# Patient Record
Sex: Female | Born: 2000 | Race: Black or African American | Hispanic: No | Marital: Single | Smoking: Never smoker
Health system: Southern US, Community
[De-identification: ages and names within clinical notes are randomized; demographics above are authoritative.]

## PROBLEM LIST (undated history)

## (undated) ENCOUNTER — Inpatient Hospital Stay (HOSPITAL_COMMUNITY): Payer: Self-pay

## (undated) DIAGNOSIS — I456 Pre-excitation syndrome: Secondary | ICD-10-CM

## (undated) HISTORY — DX: Pre-excitation syndrome: I45.6

## (undated) HISTORY — PX: NO PAST SURGERIES: SHX2092

---
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
## 2020-01-04 DIAGNOSIS — Z975 Presence of (intrauterine) contraceptive device: Secondary | ICD-10-CM | POA: Diagnosis not present

## 2020-01-04 DIAGNOSIS — N921 Excessive and frequent menstruation with irregular cycle: Secondary | ICD-10-CM | POA: Diagnosis not present

## 2020-01-04 DIAGNOSIS — N898 Other specified noninflammatory disorders of vagina: Secondary | ICD-10-CM | POA: Diagnosis not present

## 2020-01-04 DIAGNOSIS — T192XXA Foreign body in vulva and vagina, initial encounter: Secondary | ICD-10-CM | POA: Diagnosis not present

## 2020-01-21 DIAGNOSIS — Z309 Encounter for contraceptive management, unspecified: Secondary | ICD-10-CM | POA: Diagnosis not present

## 2020-01-21 DIAGNOSIS — Z3042 Encounter for surveillance of injectable contraceptive: Secondary | ICD-10-CM | POA: Diagnosis not present

## 2020-01-21 DIAGNOSIS — Z3046 Encounter for surveillance of implantable subdermal contraceptive: Secondary | ICD-10-CM | POA: Diagnosis not present

## 2020-07-26 DIAGNOSIS — R350 Frequency of micturition: Secondary | ICD-10-CM | POA: Diagnosis not present

## 2020-07-26 DIAGNOSIS — N898 Other specified noninflammatory disorders of vagina: Secondary | ICD-10-CM | POA: Diagnosis not present

## 2021-03-06 DIAGNOSIS — I456 Pre-excitation syndrome: Secondary | ICD-10-CM | POA: Insufficient documentation

## 2021-03-07 DIAGNOSIS — R002 Palpitations: Secondary | ICD-10-CM | POA: Diagnosis not present

## 2021-03-07 DIAGNOSIS — R079 Chest pain, unspecified: Secondary | ICD-10-CM | POA: Diagnosis not present

## 2021-03-08 DIAGNOSIS — R000 Tachycardia, unspecified: Secondary | ICD-10-CM | POA: Diagnosis not present

## 2021-03-08 DIAGNOSIS — I456 Pre-excitation syndrome: Secondary | ICD-10-CM | POA: Diagnosis not present

## 2021-03-08 DIAGNOSIS — R002 Palpitations: Secondary | ICD-10-CM | POA: Diagnosis not present

## 2021-03-08 DIAGNOSIS — I209 Angina pectoris, unspecified: Secondary | ICD-10-CM | POA: Diagnosis not present

## 2021-07-19 ENCOUNTER — Emergency Department (HOSPITAL_COMMUNITY)
Admission: EM | Admit: 2021-07-19 | Discharge: 2021-07-19 | Discharge status: Against Medical Advice | Payer: Medicaid Other | Source: Home / Self Care

## 2021-07-20 ENCOUNTER — Other Ambulatory Visit: Payer: Self-pay

## 2021-07-20 ENCOUNTER — Encounter (HOSPITAL_BASED_OUTPATIENT_CLINIC_OR_DEPARTMENT_OTHER): Payer: Self-pay

## 2021-07-20 DIAGNOSIS — R9431 Abnormal electrocardiogram [ECG] [EKG]: Secondary | ICD-10-CM | POA: Diagnosis not present

## 2021-07-20 DIAGNOSIS — I456 Pre-excitation syndrome: Secondary | ICD-10-CM | POA: Insufficient documentation

## 2021-07-20 DIAGNOSIS — R002 Palpitations: Secondary | ICD-10-CM | POA: Diagnosis not present

## 2021-07-20 LAB — CBC
HCT: 42.6 % (ref 36.0–46.0)
Hemoglobin: 14.3 g/dL (ref 12.0–15.0)
MCH: 30.7 pg (ref 26.0–34.0)
MCHC: 33.6 g/dL (ref 30.0–36.0)
MCV: 91.4 fL (ref 80.0–100.0)
Platelets: 303 10*3/uL (ref 150–400)
RBC: 4.66 MIL/uL (ref 3.87–5.11)
RDW: 12.2 % (ref 11.5–15.5)
WBC: 8.8 10*3/uL (ref 4.0–10.5)
nRBC: 0 % (ref 0.0–0.2)

## 2021-07-20 NOTE — ED Triage Notes (Signed)
Pt. States she has run out of her medication; taking metoprolol and ran out of it;taking 25 mg and started having palpitations. Pt. States she is having chest pains 3/10. Pt states pain center of chest. 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 Pt. States she is having minor dizziness, denies nausea, vomiting.

## 2021-07-21 ENCOUNTER — Emergency Department (HOSPITAL_BASED_OUTPATIENT_CLINIC_OR_DEPARTMENT_OTHER)
Admission: EM | Admit: 2021-07-21 | Discharge: 2021-07-21 | Disposition: A | Discharge status: Home / Self Care | Payer: Medicaid Other | Attending: Emergency Medicine | Admitting: Emergency Medicine

## 2021-07-21 ENCOUNTER — Encounter (HOSPITAL_BASED_OUTPATIENT_CLINIC_OR_DEPARTMENT_OTHER): Payer: Self-pay | Admitting: Emergency Medicine

## 2021-07-21 ENCOUNTER — Emergency Department (HOSPITAL_BASED_OUTPATIENT_CLINIC_OR_DEPARTMENT_OTHER): Payer: Medicaid Other | Admitting: Radiology

## 2021-07-21 DIAGNOSIS — R002 Palpitations: Secondary | ICD-10-CM

## 2021-07-21 DIAGNOSIS — I456 Pre-excitation syndrome: Secondary | ICD-10-CM

## 2021-07-21 DIAGNOSIS — R9431 Abnormal electrocardiogram [ECG] [EKG]: Secondary | ICD-10-CM

## 2021-07-21 LAB — BASIC METABOLIC PANEL
Anion gap: 9 (ref 5–15)
BUN: 8 mg/dL (ref 6–20)
CO2: 24 mmol/L (ref 22–32)
Calcium: 9.6 mg/dL (ref 8.9–10.3)
Chloride: 107 mmol/L (ref 98–111)
Creatinine, Ser: 0.84 mg/dL (ref 0.44–1.00)
GFR, Estimated: >60 mL/min (ref >60)
Glucose, Bld: 81 mg/dL (ref 70–99)
Potassium: 3.7 mmol/L (ref 3.5–5.1)
Sodium: 140 mmol/L (ref 135–145)

## 2021-07-21 LAB — PREGNANCY, URINE: Preg Test, Ur: NEGATIVE

## 2021-07-21 LAB — TROPONIN I (HIGH SENSITIVITY): Troponin I (High Sensitivity): 2 ng/L (ref <18)

## 2021-07-21 LAB — D-DIMER, QUANTITATIVE: D-Dimer, Quant: <0.27 ug/mL-FEU (ref 0.00–0.50)

## 2021-07-21 MED ORDER — METOPROLOL SUCCINATE ER 25 MG PO TB24: 25.0000 mg | ORAL_TABLET | Freq: Every day | ORAL | 0 refills | Status: DC

## 2021-07-21 NOTE — ED Provider Notes (Signed)
[facility] EMERGENCY DEPT Provider Note   CSN: [id number] Arrival date & time: 07/20/21  2304     History  Chief Complaint  Patient presents with   Palpitations    Hannah Esparza is a 21 y.o. female.   Palpitations Palpitations quality:  Fast Onset quality:  Sudden Timing:  Constant Progression:  Unchanged Chronicity:  New Context: not anxiety, not appetite suppressants, not blood loss, not bronchodilators, not dehydration, not exercise, not hyperventilation, not nicotine and not stimulant use   Relieved by:  Nothing Worsened by:  Nothing Ineffective treatments:  None tried Associated symptoms: no back pain, no chest pain, no chest pressure, no lower extremity edema, no PND and no shortness of breath   Risk factors: no diabetes mellitus, no hx of atrial fibrillation, no hx of PE and no hyperthyroidism   Risk factors comment:  WPW Patient who reports WPW diagnosed in [place] presents with palpitations and is out of her metoprolol.  No SOB.  No DOE.      Home Medications Prior to Admission medications   Medication Sig Start Date End Date Taking? Authorizing Provider  metoprolol succinate (TOPROL-XL) 25 MG 24 hr tablet Take 1 tablet (25 mg total) by mouth daily. 07/21/21  Yes Chayden Garrelts, MD      Allergies    Patient has no known allergies.    Review of Systems   Review of Systems  Constitutional:  Negative for fever.  HENT:  Negative for congestion and facial swelling.   Eyes:  Negative for redness.  Respiratory:  Negative for shortness of breath.   Cardiovascular:  Positive for palpitations. Negative for chest pain and PND.  Genitourinary:  Negative for difficulty urinating.  Musculoskeletal:  Negative for back pain.  Skin:  Negative for rash.  Neurological:  Negative for facial asymmetry.  Psychiatric/Behavioral:  Negative for agitation.   All other systems reviewed and are negative.  Physical Exam Updated Vital Signs BP 109/73    Pulse 81     Temp 98.3 F (36.8 C)    Resp 18    Ht 5' 5.25" (1.657 m)    Wt 56.7 kg    LMP 07/12/2021 (Exact Date)    SpO2 100%    BMI 20.64 kg/m  Physical Exam Vitals and nursing note reviewed.  Constitutional:      General: She is not in acute distress.    Appearance: Normal appearance.     Comments: Smells of marijuana   HENT:     Head: Normocephalic and atraumatic.     Nose: Nose normal.  Eyes:     Extraocular Movements: Extraocular movements intact.     Conjunctiva/sclera: Conjunctivae normal.     Pupils: Pupils are equal, round, and reactive to light.  Cardiovascular:     Rate and Rhythm: Normal rate and regular rhythm.     Pulses: Normal pulses.     Heart sounds: Normal heart sounds.  Pulmonary:     Effort: Pulmonary effort is normal.     Breath sounds: Normal breath sounds.  Abdominal:     General: Abdomen is flat. Bowel sounds are normal.     Palpations: Abdomen is soft.     Tenderness: There is no abdominal tenderness. There is no guarding.  Musculoskeletal:        General: No tenderness. Normal range of motion.     Right lower leg: No edema.     Left lower leg: No edema.  Skin:    General: Skin is warm and  dry.     Capillary Refill: Capillary refill takes less than 2 seconds.  Neurological:     General: No focal deficit present.     Mental Status: She is alert and oriented to person, place, and time.     Deep Tendon Reflexes: Reflexes normal.  Psychiatric:        Behavior: Behavior normal.    ED Results / Procedures / Treatments   Labs (all labs ordered are listed, but only abnormal results are displayed) Labs Reviewed  BASIC METABOLIC PANEL  CBC  PREGNANCY, URINE  D-DIMER, QUANTITATIVE  TROPONIN I (HIGH SENSITIVITY)  TROPONIN I (HIGH SENSITIVITY)    EKG None  Radiology DG Chest 2 View  Result Date: 07/21/2021 CLINICAL DATA:  Palpitations. EXAM: CHEST - 2 VIEW COMPARISON:  May 10, 2013 FINDINGS: The heart size and mediastinal contours are within normal  limits. Both lungs are clear. The visualized skeletal structures are unremarkable. IMPRESSION: No active cardiopulmonary disease. Electronically Signed   By: Aram Candela M.D.   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    Details: notes from OSH in care everywhere Labs: ordered.    Details: personally reviewed: normal CBC, normal creatinine and electrolytes, normal troponin and negative ddimer < .27 Radiology: ordered.    Details: normal chest xray as reviewed by me, concur with radiology Discussion of management or test interpretation with external provider(s): 219 case d/w Dr. Patsi Sears of cardiology.  Who has reviewed the EKG and changes are secondary to WPW, he states that toprol XL 25 mg should be started.  EDP asked is this was recommended given WPW and Dr. Patsi Sears states even with WPW this is safe at this time.  Also states that patient should follow up with cardiology in the office.    Risk Prescription drug management. Risk Details: Patient with known WPW feeling palpitations but HR is normal in the ED.  Ruled out for MI in the ED as well as PE with negative Ddimer in a low risk patient.  Patient restarted on metoprolol and referred to cardiology as outpatient.  Advised to avoid, caffeine, nicotine, vaping, energy drinks, stimulants and marijuana.  Patient agrees to follow up.      Final Clinical Impression(s) / ED Diagnoses Final diagnoses:  Palpitations  Abnormal EKG  WPW syndrome   Return for intractable cough, coughing up blood, fevers > 100.4 unrelieved by  medication, shortness of breath, intractable vomiting, chest pain, shortness of breath, weakness, numbness, changes in speech, facial asymmetry, abdominal pain, passing out, Inability to tolerate liquids or food, cough, altered mental status or any concerns. No signs of systemic illness or infection. The patient is nontoxic-appearing on exam and vital signs are within normal limits.  I have reviewed the triage vital signs and the nursing notes. Pertinent labs & imaging results that were available during my care of the patient were reviewed by me and considered in my medical decision making (see chart for details). After history, exam, and medical  workup I feel the patient has been appropriately medically screened and is safe for discharge home. Pertinent diagnoses were discussed with the patient. Patient was given return precautions.   Rx / DC Orders ED Discharge Orders          Ordered    metoprolol succinate (TOPROL-XL) 25 MG 24 hr tablet  Daily        07/21/21 0240              Bianney Rockwood, MD 07/21/21 6222

## 2021-07-21 NOTE — ED Notes (Signed)
Pt verbalizes understanding of discharge instructions. Opportunity for questioning and answers were provided. Pt discharged from ED to home.   ? ?

## 2021-08-13 ENCOUNTER — Emergency Department (HOSPITAL_BASED_OUTPATIENT_CLINIC_OR_DEPARTMENT_OTHER)
Admission: EM | Admit: 2021-08-13 | Discharge: 2021-08-13 | Disposition: A | Discharge status: Home / Self Care | Payer: Medicaid Other | Attending: Emergency Medicine | Admitting: Emergency Medicine

## 2021-08-13 ENCOUNTER — Encounter (HOSPITAL_BASED_OUTPATIENT_CLINIC_OR_DEPARTMENT_OTHER): Payer: Self-pay | Admitting: Emergency Medicine

## 2021-08-13 ENCOUNTER — Other Ambulatory Visit: Payer: Self-pay

## 2021-08-13 DIAGNOSIS — Z711 Person with feared health complaint in whom no diagnosis is made: Secondary | ICD-10-CM

## 2021-08-13 DIAGNOSIS — A64 Unspecified sexually transmitted disease: Secondary | ICD-10-CM | POA: Insufficient documentation

## 2021-08-13 DIAGNOSIS — Z202 Contact with and (suspected) exposure to infections with a predominantly sexual mode of transmission: Secondary | ICD-10-CM | POA: Diagnosis not present

## 2021-08-13 LAB — WET PREP, GENITAL
Clue Cells Wet Prep HPF POC: NONE SEEN
Trich, Wet Prep: NONE SEEN
WBC, Wet Prep HPF POC: <10 (ref <10)
Yeast Wet Prep HPF POC: NONE SEEN

## 2021-08-13 NOTE — ED Triage Notes (Signed)
Pt is requesting STD testing because she ahs a new boyfriend. Denies any known exposure and denies any pain. Pt states discharge has changed with a different smell so she wants to be sure is isn't an STD.  ?

## 2021-08-13 NOTE — ED Notes (Signed)
Patient verbalizes understanding of discharge instructions. Opportunity for questioning and answers were provided. Patient discharged from ED.  °

## 2021-08-13 NOTE — ED Provider Notes (Signed)
?[facility] EMERGENCY DEPT ?Provider Note ? ? ?CSN: [id number] ?Arrival date & time: 08/13/21  1249 ? ?  ? ?History ? ?Chief Complaint  ?Patient presents with  ? SEXUALLY TRANSMITTED DISEASE  ? ? ?Hannah Esparza is a 21 y.o. female. ? ?Patient presents to ER chief complaint of concern for STD.  She states she has a new boyfriend and she is sexually active, has noticed a change in her vaginal discharge and a different smell per patient.  Otherwise denies any pain denies any fevers or cough or vomiting or diarrhea.  No vaginal bleeding reported. ? ? ?  ? ?Home Medications ?Prior to Admission medications   ?Medication Sig Start Date End Date Taking? Authorizing Provider  ?metoprolol succinate (TOPROL-XL) 25 MG 24 hr tablet Take 1 tablet (25 mg total) by mouth daily. 07/21/21   Palumbo, April, MD  ?   ? ?Allergies    ?Patient has no known allergies.   ? ?Review of Systems   ?Review of Systems  ?Constitutional:  Negative for fever.  ?HENT:  Negative for ear pain.   ?Eyes:  Negative for pain.  ?Respiratory:  Negative for cough.   ?Cardiovascular:  Negative for chest pain.  ?Gastrointestinal:  Negative for abdominal pain.  ?Genitourinary:  Negative for flank pain.  ?Musculoskeletal:  Negative for back pain.  ?Skin:  Negative for rash.  ?Neurological:  Negative for headaches.  ? ?Physical Exam ?Updated Vital Signs ?BP 120/77   Pulse 85   Temp 97.7 ?F (36.5 ?C) (Temporal)   Resp 18   Ht 5\' 5"  (1.651 m)   Wt 56.7 kg   LMP 07/27/2021 (Exact Date)   SpO2 100%   BMI 20.80 kg/m?  ?Physical Exam ?Constitutional:   ?   General: She is not in acute distress. ?   Appearance: Normal appearance.  ?HENT:  ?   Head: Normocephalic.  ?   Nose: Nose normal.  ?Eyes:  ?   Extraocular Movements: Extraocular movements intact.  ?Cardiovascular:  ?   Rate and Rhythm: Normal rate.  ?Pulmonary:  ?   Effort: Pulmonary effort is normal.  ?Genitourinary: ?   Comments: Pelvic exam performed with nursing chaperone present.  Whitish  clear discharge noted.  No cervical motion tenderness on exam. ?Musculoskeletal:     ?   General: Normal range of motion.  ?   Cervical back: Normal range of motion.  ?Neurological:  ?   General: No focal deficit present.  ?   Mental Status: She is alert. Mental status is at baseline.  ? ? ?ED Results / Procedures / Treatments   ?Labs ?(all labs ordered are listed, but only abnormal results are displayed) ?Labs Reviewed  ?WET PREP, GENITAL  ?GC/CHLAMYDIA PROBE AMP ([place]) NOT AT [facility]  ? ? ?EKG ?None ? ?Radiology ?No results found. ? ?Procedures ?Procedures  ? ? ?Medications Ordered in ED ?Medications - No data to display ? ?ED Course/ Medical Decision Making/ A&P ?  ?                        ?Medical Decision Making ?Amount and/or Complexity of Data Reviewed ?Labs: ordered. ? ? ?Chart review shows prior visit February 2023 for palpitations. ? ?Work-up includes a wet mount which is unremarkable.  Sample sent for gonorrhea chlamydia evaluation.  I advised the patient to follow-up with a primary care doctor regarding any additional STD testing needs.  Patient expressed understanding.  Discharged home in stable condition. ? ?Advised  her not to involved in sexual intercourse until her results have returned negative or she is cleared by her primary care doctor. ? ? ? ? ? ? ? ?Final Clinical Impression(s) / ED Diagnoses ?Final diagnoses:  ?Concern about STD in female without diagnosis  ? ? ?Rx / DC Orders ?ED Discharge Orders   ? ? None  ? ?  ? ? ?  ?Cheryll Cockayne, MD ?08/13/21 1438 ? ?

## 2021-08-13 NOTE — ED Notes (Signed)
Pelvic is setup at bedside 

## 2021-08-13 NOTE — Discharge Instructions (Addendum)
Your wet mount was normal.  There is no evidence of bacterial vaginosis. ? ?Follow-up results on your MyChart application.  Avoid sexual intercourse until your results have returned negative or you are cleared by your primary care doctor. ? ?Follow-up with your primary care doctor's office this week.  Return to the ER if you have fevers pain or any additional concerns. ?

## 2021-08-14 LAB — GC/CHLAMYDIA PROBE AMP (~~LOC~~) NOT AT ARMC
Chlamydia: NEGATIVE
Comment: NEGATIVE
Comment: NORMAL
Neisseria Gonorrhea: NEGATIVE

## 2021-08-16 NOTE — Progress Notes (Signed)
?Cardiology Office Note:   ? ?Date:  08/17/2021  ? ?ID:  Hannah Esparza, DOB 07-Jul-2000, MRN [id number] ? ?PCP:  Pcp, No  ?Cardiologist:  Buford Dresser, MD ? ?Referring MD: Loyola Mast, PA-C  ? ?CC: new patient evaluation for palpitations and history of WPW ? ?History of Present Illness:   ? ?Hannah Esparza is a 21 y.o. female with a hx of WPW syndrome, who is seen as a new consult at the request of Loyola Mast, PA-C for the evaluation and management of palpitations. ? ?Cardiac history: She presented to ED in [place] 03/06/2021 with palpitations and associated dizziness, shortness of breath, and chest discomfort. EKG on admission showed sinus rhythm with short PR interval, pre-excitation, concerning for Wolff-Parkinson-White syndrome. Had treadmill stress test 10/6 with no evidence of reentry tachycardia. She was started on metoprolol and discharged for outpatient cardiology follow-up. ? ?She was seen in the [facility] 07/21/2021 for palpitations. D-dimer was negative. She was restarted on metoprolol and was advised to follow-up with cardiology. ? ?Tachycardia/palpitations: ?-Initial onset: Palpitations were consistent and bothersome beginning in 03/2021. ?-Frequency/Duration: Palpitations occur often, multiple intermittent episodes usually every day. They are "small and don't last long." ?-Associated symptoms: Initially chest pain and shortness of breath, not associated with palpitations today. Recently only dizziness which seems to be worse than her actual palpitations. ?-Aggravating/alleviating factors: Anxiety/Nervousness worsens palpitations. ?-Syncope/near syncope: None. ?-Prior cardiac history: Chest pain in middle school, EKG showed abnormality but no further workup was done. ?-Prior ECG: EKG in 03/2021 at [place] ED showed sinus rhythm with short PR interval, pre-excitation, concerning for Wolff-Parkinson-White syndrome.  ?-Prior workup: Treadmill Stress and Ultrasound done in [place].  ?-Prior  treatment: On metoprolol. Remains compliant today. ?-Possible medication interactions: None. ?-Caffeine: None. ?-Alcohol: Occasional. ?-Tobacco:  Occasional marijuana use. Recently quit vapes. ?-OTC supplements: None. ?-Comorbidities: WPW  ?-Exercise level: In the last few months, her most strenuous activity was walking and carrying furniture. At her last job, she had a 10 minute walk with steep hill twice a day. ?-Labs: TSH, kidney function/electrolytes, CBC reviewed. ?-Cardiac ROS: no chest pain, no shortness of breath, no PND, no orthopnea, no LE edema. She denies any headaches. ?-Family history: She believes her mother has a history of cardiovascular issues, she will let us know when she finds out for sure. No known sudden deaths in her family. ? ?History reviewed. No pertinent past medical history. ? ?History reviewed. No pertinent surgical history. ? ?Current Medications: ?Current Outpatient Medications on File Prior to Visit  ?Medication Sig  ? metoprolol succinate (TOPROL-XL) 25 MG 24 hr tablet Take 1 tablet (25 mg total) by mouth daily.  ? ?No current facility-administered medications on file prior to visit.  ?  ? ?Allergies:   Patient has no known allergies.  ? ?Social History  ? ?Tobacco Use  ? Smoking status: Never  ?  Passive exposure: Yes  ?Substance Use Topics  ? Alcohol use: No  ? Drug use: No  ? ? ?Family History: ?Denies family history of sudden cardiac death. Mother has unclear heart issue, she will get more information ? ?ROS:   ?Please see the history of present illness.  Additional pertinent ROS: ?Constitutional: Negative for chills, fever, night sweats, unintentional weight loss  ?HENT: Negative for ear pain and hearing loss.   ?Eyes: Negative for loss of vision and eye pain.  ?Respiratory: Negative for cough, sputum, wheezing.   ?Cardiovascular: See HPI. ?Gastrointestinal: Negative for abdominal pain, melena, and hematochezia.  ?Genitourinary: Negative for  dysuria and hematuria.   ?Musculoskeletal: Negative for falls and myalgias.  ?Skin: Negative for itching and rash.  ?Neurological: Negative for focal weakness, focal sensory changes and loss of consciousness. Positive for dizziness, anxiety. ?Endo/Heme/Allergies: Does not bruise/bleed easily.   ? ? ?EKGs/Labs/Other Studies Reviewed:   ? ?The following studies were reviewed today: ? ?Stress Test 03/08/2021 ([facility]): ?Summary:  ?1.  No arrhythmias after submaximal exercise.   ?2.  Stress ECG conclusions: There are no stress arrhythmias or conduction abnormalities at below mentioned workload. The stress ECG revealed persistent ventricular pre-excitation.   ? ?Recommendations:  ?1.  No evidence of reentry tachycardia in response to treadmill stress.   ?2.  Follow-up as outpatient for event monitor and electrophysiology consult.   ?3.  Encourage cessation of THC and vaping.   ? ?EKG:  EKG is personally reviewed.   ?08/17/2021: sinus rhythm at 71 bpm. There is abnormal conduction with short PR. To me only does not appear to be textbook delta wave, but there is slurring of the onset of the R wave. ? ?Recent Labs: ?07/20/2021: BUN 8; Creatinine, Ser 0.84; Hemoglobin 14.3; Platelets 303; Potassium 3.7; Sodium 140  ? ?Recent Lipid Panel ?No results found for: CHOL, TRIG, HDL, CHOLHDL, VLDL, LDLCALC, LDLDIRECT ? ?Physical Exam:   ? ?VS:  BP 100/62   Pulse 71   Ht 5\' 5"  (1.651 m)   Wt 121 lb 6.4 oz (55.1 kg)   LMP 07/27/2021 (Exact Date)   SpO2 99%   BMI 20.20 kg/m?    ? ?Wt Readings from Last 3 Encounters:  ?08/17/21 121 lb 6.4 oz (55.1 kg)  ?08/13/21 125 lb (56.7 kg)  ?07/20/21 125 lb (56.7 kg)  ?  ?GEN: Well nourished, well developed in no acute distress ?HEENT: Normal, moist mucous membranes ?NECK: No JVD ?CARDIAC: regular rhythm, normal S1 and S2, no rubs or gallops. No murmur. ?VASCULAR: Radial and DP pulses 2+ bilaterally. No carotid bruits ?RESPIRATORY:  Clear to auscultation without rales, wheezing or rhonchi  ?ABDOMEN:  Soft, non-tender, non-distended ?MUSCULOSKELETAL:  Ambulates independently ?SKIN: Warm and dry, no edema ?NEUROLOGIC:  Alert and oriented x 3. No focal neuro deficits noted. ?PSYCHIATRIC:  Normal affect   ? ?ASSESSMENT:   ? ?1. Heart palpitations   ?2. Nonspecific abnormal electrocardiogram (ECG) (EKG)   ?3. History of Wolff-Parkinson-White (WPW) syndrome   ?4. Cardiac risk counseling   ?5. Counseling on health promotion and disease prevention   ? ?PLAN:   ? ?Palpitations ?Reported history of WPW ?Abnormal ECG ?-no syncope or high risk features ?-no family history of sudden cardiac death ?-ECG is abnormal. There is a conduction abnormality at the onset of the QRS complex, though it does not appear textbook for a delta wave. There is a short p-onset of R interval ?-with frequent symptoms, will get 7 day zio monitor, placed today ?-will see back to review results, possible EP referral based on results ?-reviewed red flag warning signs that need immediate medical attention ? ?Cardiac risk counseling and prevention recommendations: ?-recommend heart healthy/Mediterranean diet, with whole grains, fruits, vegetable, fish, lean meats, nuts, and olive oil. Limit salt. ?-recommend moderate walking, 3-5 times/week for 30-50 minutes each session. Aim for at least 150 minutes.week. Goal should be pace of 3 miles/hours, or walking 1.5 miles in 30 minutes ?-recommend avoidance of tobacco products. Avoid excess alcohol. ?-ASCVD risk score: ?The ASCVD Risk score (Arnett DK, et al., 2019) failed to calculate for the following reasons: ?  The 2019 ASCVD risk score is  only valid for ages 10 to 51   ? ?Plan for follow up: 6 weeks or sooner as needed. ? ?Buford Dresser, MD, PhD, [facility] ?[place]   ? ?Medication Adjustments/Labs and Tests Ordered: ?Current medicines are reviewed at length with the patient today.  Concerns regarding medicines are outlined above.  ? ?Orders Placed This Encounter  ?Procedures  ?  LONG TERM MONITOR (3-14 DAYS)  ? EKG 12-Lead  ? ?No orders of the defined types were placed in this encounter. ? ?Patient Instructions  ?Medication Instructions:  ?Your physician recommends that you continue on your current medications

## 2021-08-17 ENCOUNTER — Encounter (HOSPITAL_BASED_OUTPATIENT_CLINIC_OR_DEPARTMENT_OTHER): Payer: Self-pay | Admitting: Cardiology

## 2021-08-17 ENCOUNTER — Ambulatory Visit (INDEPENDENT_AMBULATORY_CARE_PROVIDER_SITE_OTHER): Payer: Medicaid Other | Admitting: Cardiology

## 2021-08-17 ENCOUNTER — Ambulatory Visit (HOSPITAL_BASED_OUTPATIENT_CLINIC_OR_DEPARTMENT_OTHER): Payer: Medicaid Other

## 2021-08-17 ENCOUNTER — Other Ambulatory Visit: Payer: Self-pay

## 2021-08-17 VITALS — BP 100/62 | HR 71 | Ht 65.0 in | Wt 121.4 lb

## 2021-08-17 DIAGNOSIS — R9431 Abnormal electrocardiogram [ECG] [EKG]: Secondary | ICD-10-CM | POA: Diagnosis not present

## 2021-08-17 DIAGNOSIS — Z8679 Personal history of other diseases of the circulatory system: Secondary | ICD-10-CM | POA: Diagnosis not present

## 2021-08-17 DIAGNOSIS — R002 Palpitations: Secondary | ICD-10-CM

## 2021-08-17 DIAGNOSIS — Z7189 Other specified counseling: Secondary | ICD-10-CM | POA: Diagnosis not present

## 2021-08-17 NOTE — Patient Instructions (Signed)
Medication Instructions:  ?Your physician recommends that you continue on your current medications as directed. Please refer to the Current Medication list given to you today.  ? ?*If you need a refill on your cardiac medications before your next appointment, please call your pharmacy* ? ?Lab Work: ?NONE ? ?Testing/Procedures: ?7 DAY ZIO  ? ?Follow-Up: ?At [facility], you and your health needs are our priority.  As part of our continuing mission to provide you with exceptional heart care, we have created designated Provider Care Teams.  These Care Teams include your primary Cardiologist (physician) and Advanced Practice Providers (APPs -  Physician Assistants and Nurse Practitioners) who all work together to provide you with the care you need, when you need it. ? ?We recommend signing up for the patient portal called "MyChart".  Sign up information is provided on this After Visit Summary.  MyChart is used to connect with patients for Virtual Visits (Telemedicine).  Patients are able to view lab/test results, encounter notes, upcoming appointments, etc.  Non-urgent messages can be sent to your provider as well.   ?To learn more about what you can do with MyChart, go to [email or web address].   ? ?Your next appointment:   ?6 week(s) ? ?The format for your next appointment:   ?In Person ? ?Provider:   ?Jodelle Red, MD{ ? ?

## 2021-08-22 ENCOUNTER — Ambulatory Visit (HOSPITAL_BASED_OUTPATIENT_CLINIC_OR_DEPARTMENT_OTHER): Payer: Medicaid Other | Admitting: Family Medicine

## 2021-08-25 ENCOUNTER — Emergency Department (HOSPITAL_COMMUNITY)
Admission: EM | Admit: 2021-08-25 | Discharge: 2021-08-25 | Disposition: A | Discharge status: Home / Self Care | Payer: Medicaid Other | Attending: Emergency Medicine | Admitting: Emergency Medicine

## 2021-08-25 ENCOUNTER — Other Ambulatory Visit: Payer: Self-pay

## 2021-08-25 DIAGNOSIS — I456 Pre-excitation syndrome: Secondary | ICD-10-CM

## 2021-08-25 DIAGNOSIS — Z202 Contact with and (suspected) exposure to infections with a predominantly sexual mode of transmission: Secondary | ICD-10-CM | POA: Diagnosis not present

## 2021-08-25 DIAGNOSIS — F419 Anxiety disorder, unspecified: Secondary | ICD-10-CM | POA: Diagnosis not present

## 2021-08-25 DIAGNOSIS — F439 Reaction to severe stress, unspecified: Secondary | ICD-10-CM | POA: Insufficient documentation

## 2021-08-25 DIAGNOSIS — R0789 Other chest pain: Secondary | ICD-10-CM | POA: Diagnosis not present

## 2021-08-25 DIAGNOSIS — R002 Palpitations: Secondary | ICD-10-CM

## 2021-08-25 DIAGNOSIS — Z79899 Other long term (current) drug therapy: Secondary | ICD-10-CM | POA: Diagnosis not present

## 2021-08-25 DIAGNOSIS — R000 Tachycardia, unspecified: Secondary | ICD-10-CM | POA: Diagnosis not present

## 2021-08-25 DIAGNOSIS — R079 Chest pain, unspecified: Secondary | ICD-10-CM | POA: Diagnosis not present

## 2021-08-25 LAB — BASIC METABOLIC PANEL
Anion gap: 8 (ref 5–15)
BUN: 8 mg/dL (ref 6–20)
CO2: 21 mmol/L — ABNORMAL LOW (ref 22–32)
Calcium: 9.3 mg/dL (ref 8.9–10.3)
Chloride: 109 mmol/L (ref 98–111)
Creatinine, Ser: 0.97 mg/dL (ref 0.44–1.00)
GFR, Estimated: >60 mL/min (ref >60)
Glucose, Bld: 91 mg/dL (ref 70–99)
Potassium: 3.8 mmol/L (ref 3.5–5.1)
Sodium: 138 mmol/L (ref 135–145)

## 2021-08-25 LAB — CBC WITH DIFFERENTIAL/PLATELET
Abs Immature Granulocytes: 0.03 10*3/uL (ref 0.00–0.07)
Basophils Absolute: 0 10*3/uL (ref 0.0–0.1)
Basophils Relative: 0 %
Eosinophils Absolute: 0 10*3/uL (ref 0.0–0.5)
Eosinophils Relative: 0 %
HCT: 36.6 % (ref 36.0–46.0)
Hemoglobin: 12.5 g/dL (ref 12.0–15.0)
Immature Granulocytes: 0 %
Lymphocytes Relative: 31 %
Lymphs Abs: 2.9 10*3/uL (ref 0.7–4.0)
MCH: 31.2 pg (ref 26.0–34.0)
MCHC: 34.2 g/dL (ref 30.0–36.0)
MCV: 91.3 fL (ref 80.0–100.0)
Monocytes Absolute: 0.5 10*3/uL (ref 0.1–1.0)
Monocytes Relative: 5 %
Neutro Abs: 5.8 10*3/uL (ref 1.7–7.7)
Neutrophils Relative %: 64 %
Platelets: 230 10*3/uL (ref 150–400)
RBC: 4.01 MIL/uL (ref 3.87–5.11)
RDW: 12 % (ref 11.5–15.5)
WBC: 9.2 10*3/uL (ref 4.0–10.5)
nRBC: 0 % (ref 0.0–0.2)

## 2021-08-25 LAB — MAGNESIUM: Magnesium: 2 mg/dL (ref 1.7–2.4)

## 2021-08-25 MED ORDER — PROPRANOLOL HCL 10 MG PO TABS
10.0000 mg | ORAL_TABLET | Freq: Once | ORAL | Status: AC
  Administered 2021-08-25: 10 mg via ORAL
  Filled 2021-08-25: qty 1

## 2021-08-25 MED ORDER — PROPRANOLOL HCL 10 MG PO TABS: 10.0000 mg | ORAL_TABLET | Freq: Three times a day (TID) | ORAL | 0 refills | Status: DC | PRN

## 2021-08-25 NOTE — ED Provider Notes (Signed)
?[facility] EMERGENCY DEPARTMENT ?Provider Note ? ? ?CSN: [id number] ?Arrival date & time: 08/25/21  0132 ? ?  ? ?History ? ?Chief Complaint  ?Patient presents with  ? Palpitations  ? ? ?Malin Sambrano is a 21 y.o. female. ? ?21 year old female who has a history of Wolff-Parkinson-White the presents emerged from today with palpitations.  Patient states that she was having a very stressful situation and started feeling anxious and started feeling her heart beating fast and having some chest pressure so her friend called EMS and brought her here for further evaluation.  She was diagnosed with WPW back in October.  She has had on and off again palpitations since then but mostly managed metoprolol.  They stopped her metoprolol recently so they can do a Holter monitor which she has at home as she supposed to send it back today.  States that she is not had any syncopal episodes.  Eating drinking normally but at baseline does not drink much water ? ? ?Palpitations ? ?  ? ?Home Medications ?Prior to Admission medications   ?Medication Sig Start Date End Date Taking? Authorizing Provider  ?metoprolol succinate (TOPROL-XL) 25 MG 24 hr tablet Take 1 tablet (25 mg total) by mouth daily. 07/21/21   Palumbo, April, MD  ?   ? ?Allergies    ?Patient has no known allergies.   ? ?Review of Systems   ?Review of Systems  ?Cardiovascular:  Positive for palpitations.  ? ?Physical Exam ?Updated Vital Signs ?BP 114/69   Pulse 72   Temp 98.2 ?F (36.8 ?C) (Oral)   Resp 17   LMP 07/27/2021 (Exact Date)   SpO2 100%  ?Physical Exam ?Vitals and nursing note reviewed.  ?Constitutional:   ?   Appearance: She is well-developed.  ?HENT:  ?   Head: Normocephalic and atraumatic.  ?   Nose: No congestion or rhinorrhea.  ?   Mouth/Throat:  ?   Mouth: Mucous membranes are moist.  ?   Pharynx: Oropharynx is clear.  ?Eyes:  ?   Pupils: Pupils are equal, round, and reactive to light.  ?Cardiovascular:  ?   Rate and Rhythm: Normal rate and  regular rhythm.  ?Pulmonary:  ?   Effort: No respiratory distress.  ?   Breath sounds: No stridor.  ?Abdominal:  ?   General: Abdomen is flat. There is no distension.  ?Musculoskeletal:     ?   General: No swelling or tenderness. Normal range of motion.  ?   Cervical back: Normal range of motion.  ?Skin: ?   General: Skin is warm and dry.  ?Neurological:  ?   General: No focal deficit present.  ?   Mental Status: She is alert.  ? ? ?ED Results / Procedures / Treatments   ?Labs ?(all labs ordered are listed, but only abnormal results are displayed) ?Labs Reviewed  ?BASIC METABOLIC PANEL - Abnormal; Notable for the following components:  ?    Result Value  ? CO2 21 (*)   ? All other components within normal limits  ?CBC WITH DIFFERENTIAL/PLATELET  ?MAGNESIUM  ? ? ?EKG ?None ? ?Radiology ?No results found. ? ?Procedures ?Procedures  ? ? ?Medications Ordered in ED ?Medications  ?propranolol (INDERAL) tablet 10 mg (10 mg Oral Given 08/25/21 0440)  ? ? ?ED Course/ Medical Decision Making/ A&P ?  ?                        ?Medical Decision Making ?  Amount and/or Complexity of Data Reviewed ?Labs: ordered. ?ECG/medicine tests: ordered. ? ?Risk ?Prescription drug management. ? ? ?Overall workup reassuring. Will start PRN propranolol until cardiology follow up.  ? ?Final Clinical Impression(s) / ED Diagnoses ?Final diagnoses:  ?None  ? ? ?Rx / DC Orders ?ED Discharge Orders   ? ? None  ? ?  ? ? ?  ?Marily Memos, MD ?08/25/21 [phone] ? ?

## 2021-08-25 NOTE — ED Triage Notes (Signed)
Pt bib [facility] from home c/o palpitations that started at 10pm last night. Pt was having anxiety related to personal things. Pt has hx of WPW. Pt was recently wearing a Holter monitor and taking metoprolol and was taken off of it.  ? ?EMS vitals ?120 HR NSR ?98% O2 ?117/61 BP ?18g Lt AC ?500CC IVF ?

## 2021-08-30 ENCOUNTER — Encounter (HOSPITAL_BASED_OUTPATIENT_CLINIC_OR_DEPARTMENT_OTHER): Payer: Self-pay | Admitting: Family Medicine

## 2021-09-26 ENCOUNTER — Encounter (HOSPITAL_BASED_OUTPATIENT_CLINIC_OR_DEPARTMENT_OTHER): Payer: Self-pay

## 2021-10-02 ENCOUNTER — Ambulatory Visit (HOSPITAL_BASED_OUTPATIENT_CLINIC_OR_DEPARTMENT_OTHER): Payer: Medicaid Other | Admitting: Cardiology

## 2021-10-18 ENCOUNTER — Encounter (HOSPITAL_BASED_OUTPATIENT_CLINIC_OR_DEPARTMENT_OTHER): Payer: Self-pay

## 2021-10-18 NOTE — Telephone Encounter (Signed)
Letter mailed to patient asking to return heart monitor.

## 2021-11-27 ENCOUNTER — Other Ambulatory Visit: Payer: Self-pay

## 2021-11-27 ENCOUNTER — Emergency Department (HOSPITAL_BASED_OUTPATIENT_CLINIC_OR_DEPARTMENT_OTHER)
Admission: EM | Admit: 2021-11-27 | Discharge: 2021-11-27 | Disposition: A | Discharge status: Home / Self Care | Payer: Medicaid Other | Attending: Emergency Medicine | Admitting: Emergency Medicine

## 2021-11-27 ENCOUNTER — Encounter (HOSPITAL_BASED_OUTPATIENT_CLINIC_OR_DEPARTMENT_OTHER): Payer: Self-pay | Admitting: Emergency Medicine

## 2021-11-27 DIAGNOSIS — Z3202 Encounter for pregnancy test, result negative: Secondary | ICD-10-CM | POA: Diagnosis not present

## 2021-11-27 DIAGNOSIS — Z3200 Encounter for pregnancy test, result unknown: Secondary | ICD-10-CM | POA: Diagnosis present

## 2021-11-27 LAB — PREGNANCY, URINE: Preg Test, Ur: NEGATIVE

## 2021-11-27 NOTE — ED Provider Notes (Signed)
[facility] EMERGENCY DEPT Provider Note   CSN: [id number] Arrival date & time: 11/27/21  1447     History  Chief Complaint  Patient presents with   Possible Pregnancy    Hannah Esparza is a 21 y.o. female who presents to the ED for a pregnancy test.  Patient states that she and her partner have been trying to get pregnant.  On June 16, she had a positive home pregnancy test and when she rechecked again today, she had a negative pregnancy test.  She decided to come to the emergency department for a test here.  Patient has primary care however she does not have OB/GYN follow-up.  Patient states that they only recently started trying and this was their first possible pregnancy.       Home Medications Prior to Admission medications   Medication Sig Start Date End Date Taking? Authorizing Provider  metoprolol succinate (TOPROL-XL) 25 MG 24 hr tablet Take 1 tablet (25 mg total) by mouth daily. 07/21/21   Palumbo, April, MD  propranolol (INDERAL) 10 MG tablet Take 1 tablet (10 mg total) by mouth 3 (three) times daily as needed. 08/25/21   Mesner, Barbara Cower, MD      Allergies    Patient has no known allergies.    Review of Systems   Review of Systems  Physical Exam Updated Vital Signs BP (!) 132/97 (BP Location: Right Arm)   Pulse 72   Temp 98.4 F (36.9 C) (Oral)   Resp 16   Ht 5\' 5"  (1.651 m)   Wt 49.9 kg   LMP 10/21/2021 (Approximate)   SpO2 100%   BMI 18.30 kg/m  Physical Exam Vitals and nursing note reviewed.  Constitutional:      General: She is not in acute distress.    Appearance: She is not ill-appearing.  HENT:     Head: Atraumatic.  Eyes:     Conjunctiva/sclera: Conjunctivae normal.  Cardiovascular:     Rate and Rhythm: Normal rate and regular rhythm.     Pulses: Normal pulses.     Heart sounds: No murmur heard. Pulmonary:     Effort: Pulmonary effort is normal. No respiratory distress.     Breath sounds: Normal breath sounds.  Abdominal:      General: Abdomen is flat. There is no distension.     Palpations: Abdomen is soft.     Tenderness: There is no abdominal tenderness.  Musculoskeletal:        General: Normal range of motion.     Cervical back: Normal range of motion.  Skin:    General: Skin is warm and dry.     Capillary Refill: Capillary refill takes less than 2 seconds.  Neurological:     General: No focal deficit present.     Mental Status: She is alert.  Psychiatric:        Mood and Affect: Mood normal.     ED Results / Procedures / Treatments   Labs (all labs ordered are listed, but only abnormal results are displayed) Labs Reviewed  PREGNANCY, URINE    EKG None  Radiology No results found.  Procedures Procedures    Medications Ordered in ED Medications - No data to display  ED Course/ Medical Decision Making/ A&P                           Medical Decision Making Amount and/or Complexity of Data Reviewed Labs: ordered.   21 year old female  presents to the ED requesting a pregnancy test.  Vitals are without significant abnormality.  She has no vaginal bleeding or discharge.  On exam there are no acute findings.  I ordered pregnancy test which was ultimately negative.  Patient was given referral to [facility] to establish OB/GYN care in case she continues to have trouble conceiving.  Patient expresses understanding is amenable to plan.  Discharged home in good condition.  Final Clinical Impression(s) / ED Diagnoses Final diagnoses:  Pregnancy test negative    Rx / DC Orders ED Discharge Orders     None         Janell Quiet, PA-C 11/27/21 1715    Vanetta Mulders, MD 12/02/21 [phone]

## 2022-05-14 ENCOUNTER — Telehealth (HOSPITAL_BASED_OUTPATIENT_CLINIC_OR_DEPARTMENT_OTHER): Payer: Self-pay | Admitting: Cardiology

## 2022-05-14 NOTE — Telephone Encounter (Signed)
RN reached out to [employer], [employer] rep to find out more information. We will return call to patient when we have more information.

## 2022-05-14 NOTE — Telephone Encounter (Signed)
Patient walked in today and states she was seen in March of this year by Dr. Cristal Deer and had a monitor placed.  She states she mailed it back to the company (regular mail). She is receiving a bill because the because the monitor was not received .  What are the next steps?

## 2022-05-15 NOTE — Telephone Encounter (Signed)
Attempted to return call to patient, left message letting patient know that we are looking into the issue and that we will call her back with an update when we have one

## 2022-05-17 NOTE — Telephone Encounter (Signed)
From [employer],   "Hey Hannah Esparza, yes!  Billing just got back to me, and we marked the device as lost, and the patient is NOT responsible for any payment.  They told me we never actually sent the patient a bill so maybe the patient received something from her insurance company? If they have any concerns, they can reach out to our customer care team and we can clarify anything for her.  Best,  Neeral"   Call attempted, no answer, left message with the above information.

## 2022-06-21 ENCOUNTER — Telehealth: Payer: Self-pay

## 2022-06-21 NOTE — Telephone Encounter (Signed)
Mychart msg sent. AS, CMA 

## 2022-06-25 ENCOUNTER — Encounter (HOSPITAL_BASED_OUTPATIENT_CLINIC_OR_DEPARTMENT_OTHER): Payer: Medicaid Other | Admitting: Advanced Practice Midwife

## 2022-06-25 ENCOUNTER — Other Ambulatory Visit (HOSPITAL_BASED_OUTPATIENT_CLINIC_OR_DEPARTMENT_OTHER): Payer: Self-pay

## 2022-06-25 ENCOUNTER — Encounter (HOSPITAL_BASED_OUTPATIENT_CLINIC_OR_DEPARTMENT_OTHER): Payer: Self-pay

## 2022-07-04 ENCOUNTER — Ambulatory Visit (HOSPITAL_BASED_OUTPATIENT_CLINIC_OR_DEPARTMENT_OTHER): Payer: Medicaid Other | Admitting: Family Medicine

## 2022-07-09 ENCOUNTER — Ambulatory Visit (HOSPITAL_BASED_OUTPATIENT_CLINIC_OR_DEPARTMENT_OTHER): Payer: Medicaid Other | Admitting: Advanced Practice Midwife

## 2023-01-23 ENCOUNTER — Ambulatory Visit (HOSPITAL_BASED_OUTPATIENT_CLINIC_OR_DEPARTMENT_OTHER): Payer: Medicaid Other | Admitting: Obstetrics & Gynecology

## 2023-03-06 ENCOUNTER — Emergency Department (HOSPITAL_BASED_OUTPATIENT_CLINIC_OR_DEPARTMENT_OTHER): Payer: Medicaid Other

## 2023-03-06 ENCOUNTER — Encounter (HOSPITAL_BASED_OUTPATIENT_CLINIC_OR_DEPARTMENT_OTHER): Payer: Self-pay | Admitting: Emergency Medicine

## 2023-03-06 ENCOUNTER — Other Ambulatory Visit: Payer: Self-pay

## 2023-03-06 ENCOUNTER — Emergency Department (HOSPITAL_BASED_OUTPATIENT_CLINIC_OR_DEPARTMENT_OTHER)
Admission: EM | Admit: 2023-03-06 | Discharge: 2023-03-06 | Disposition: A | Discharge status: Home / Self Care | Payer: Medicaid Other | Attending: Emergency Medicine | Admitting: Emergency Medicine

## 2023-03-06 DIAGNOSIS — S92414A Nondisplaced fracture of proximal phalanx of right great toe, initial encounter for closed fracture: Secondary | ICD-10-CM | POA: Insufficient documentation

## 2023-03-06 DIAGNOSIS — M79671 Pain in right foot: Secondary | ICD-10-CM | POA: Diagnosis not present

## 2023-03-06 DIAGNOSIS — W010XXA Fall on same level from slipping, tripping and stumbling without subsequent striking against object, initial encounter: Secondary | ICD-10-CM | POA: Diagnosis not present

## 2023-03-06 DIAGNOSIS — S99921A Unspecified injury of right foot, initial encounter: Secondary | ICD-10-CM | POA: Diagnosis not present

## 2023-03-06 MED ORDER — OXYCODONE-ACETAMINOPHEN 5-325 MG PO TABS
1.0000 | ORAL_TABLET | Freq: Once | ORAL | Status: AC
  Administered 2023-03-06: 1 via ORAL
  Filled 2023-03-06: qty 1

## 2023-03-06 MED ORDER — NAPROXEN 500 MG PO TABS
500.0000 mg | ORAL_TABLET | Freq: Two times a day (BID) | ORAL | 0 refills | Status: DC
  Filled 2023-03-06: qty 30, 15d supply, fill #0

## 2023-03-06 NOTE — ED Provider Notes (Signed)
[facility] Provider Note   CSN: [id number] Arrival date & time: 03/06/23  1557     History  Chief Complaint  Patient presents with   Foot Injury    Hannah Esparza is a 22 y.o. female otherwise healthy presented today for evaluation of toe pain.  Patient tripped and fell earlier today.  Denies hitting her head, LOC.  She does have pain on her right big toe.  States she can barely bear weight on the right side.  Denies any bleeding, bruises, loss of sensations on her right foot.   Foot Injury   History reviewed. No pertinent past medical history. History reviewed. No pertinent surgical history.   Home Medications Prior to Admission medications   Medication Sig Start Date End Date Taking? Authorizing Provider  metoprolol succinate (TOPROL-XL) 25 MG 24 hr tablet Take 1 tablet (25 mg total) by mouth daily. 07/21/21   Palumbo, April, MD  propranolol (INDERAL) 10 MG tablet Take 1 tablet (10 mg total) by mouth 3 (three) times daily as needed. 08/25/21   Mesner, Barbara Cower, MD      Allergies    Patient has no known allergies.    Review of Systems   Review of Systems Negative except as per HPI.  Physical Exam Updated Vital Signs BP 121/73   Pulse 77   Temp 98 F (36.7 C) (Oral)   Resp 16   Wt 48.5 kg   LMP 02/03/2023 (Exact Date)   SpO2 100%   BMI 17.81 kg/m  Physical Exam Vitals and nursing note reviewed.  Constitutional:      Appearance: Normal appearance.  HENT:     Head: Normocephalic and atraumatic.     Mouth/Throat:     Mouth: Mucous membranes are moist.  Eyes:     General: No scleral icterus. Cardiovascular:     Rate and Rhythm: Normal rate and regular rhythm.     Pulses: Normal pulses.     Heart sounds: Normal heart sounds.  Pulmonary:     Effort: Pulmonary effort is normal.     Breath sounds: Normal breath sounds.  Abdominal:     General: Abdomen is flat.     Palpations: Abdomen is soft.     Tenderness: There  is no abdominal tenderness.  Musculoskeletal:        General: No deformity.     Comments: Tenderness to palpation to right big toe with some swelling after proximal phalanx of the right big toe.  Skin:    General: Skin is warm.     Findings: No rash.  Neurological:     General: No focal deficit present.     Mental Status: She is alert.  Psychiatric:        Mood and Affect: Mood normal.     ED Results / Procedures / Treatments   Labs (all labs ordered are listed, but only abnormal results are displayed) Labs Reviewed - No data to display  EKG None  Radiology DG Foot Complete Right  Result Date: 03/06/2023 CLINICAL DATA:  Right foot injury. Trip and fall. Unable to bear weight. Pain. EXAM: RIGHT FOOT COMPLETE - 3+ VIEW COMPARISON:  Report from radiograph 11/07/2019, images unavailable. FINDINGS: Suspected fracture of the great toe proximal phalanx is seen only on the lateral view. This involves the plantar surface and extends to the metatarsal phalangeal joint. No other fracture. The alignment is normal. Mild soft tissue edema. IMPRESSION: Suspected fracture of the great toe proximal phalanx,  seen only on the lateral view. Electronically Signed   By: Narda Rutherford M.D.   On: 03/06/2023 17:08    Procedures Procedures    Medications Ordered in ED Medications  oxyCODONE-acetaminophen (PERCOCET/ROXICET) 5-325 MG per tablet 1 tablet (has no administration in time range)    ED Course/ Medical Decision Making/ A&P                                 Medical Decision Making Amount and/or Complexity of Data Reviewed Radiology: ordered.  Risk Prescription drug management.   This patient presents to the ED for toe pain, this involves an extensive number of treatment options, and is a complaint that carries with a high risk of complications and morbidity.  The differential diagnosis includes fracture, dislocation, ligamentous injury.  This is not an exhaustive list.  Imaging  studies: I ordered imaging studies, personally reviewed, interpreted imaging and agree with the radiologist's interpretations. The results include: X-ray show a proximal phalanx fracture of the right great toe.  Problem list/ ED course/ Critical interventions/ Medical management: HPI: See above Vital signs within normal range and stable throughout visit. Laboratory/imaging studies significant for: See above. On physical examination, patient is afebrile and appears in no acute distress. Patient presents with toe pain.  Neurovascular functions of the right foot intact.  Good pedal pulses.  X-ray showed a proximal phalanx fracture of the right great toe, no dislocation.  Will buddy tape and give CAM boot.  Advised patient to take Tylenol/ibuprofen/naproxen for pain, use ice for symptom control, follow-up with orthopedics or podiatry for further evaluation and management.  Strict return precaution discussed. I have reviewed the patient home medicines and have made adjustments as needed.  Cardiac monitoring/EKG: The patient was maintained on a cardiac monitor.  I personally reviewed and interpreted the cardiac monitor which showed an underlying rhythm of: sinus rhythm.  Additional history obtained: External records from outside source obtained and reviewed including: Chart review including previous notes, labs, imaging.  Consultations obtained:  Disposition Continued outpatient therapy. Follow-up with podiatry/orthopedics recommended for reevaluation of symptoms. Treatment plan discussed with patient.  Pt acknowledged understanding was agreeable to the plan. Worrisome signs and symptoms were discussed with patient, and patient acknowledged understanding to return to the ED if they noticed these signs and symptoms. Patient was stable upon discharge.   This chart was dictated using voice recognition software.  Despite best efforts to proofread,  errors can occur which can change the documentation  meaning.          Final Clinical Impression(s) / ED Diagnoses Final diagnoses:  Closed nondisplaced fracture of proximal phalanx of right great toe, initial encounter    Rx / DC Orders ED Discharge Orders          Ordered    naproxen (NAPROSYN) 500 MG tablet  2 times daily        03/06/23 1946              Jeanelle Malling, [place] 03/06/23 1958    Charlynne Pander, MD 03/06/23 [id number]

## 2023-03-06 NOTE — ED Triage Notes (Signed)
Right foot injury by the great toe , tripped and fell . No further injury . Unable to bear weight on the affected limb .

## 2023-03-06 NOTE — Discharge Instructions (Addendum)
Please take tylenol/ibuprofen/naproxen for pain. I recommend close follow-up with orthopedics or podiatry for reevaluation.  Please do not hesitate to return to emergency department if worrisome signs symptoms we discussed become apparent.

## 2023-03-06 NOTE — ED Notes (Signed)
Pt. Reports she fell over her dog injuring her R foot.

## 2023-03-07 ENCOUNTER — Other Ambulatory Visit (HOSPITAL_BASED_OUTPATIENT_CLINIC_OR_DEPARTMENT_OTHER): Payer: Self-pay

## 2023-03-13 ENCOUNTER — Ambulatory Visit: Payer: Medicaid Other | Admitting: Podiatry

## 2023-03-18 ENCOUNTER — Other Ambulatory Visit (HOSPITAL_BASED_OUTPATIENT_CLINIC_OR_DEPARTMENT_OTHER): Payer: Self-pay

## 2023-06-04 NOTE — L&D Delivery Note (Signed)
 [facility]N [facility] Delivery Note  Hannah Esparza is a 23 y.o. G1P0 s/p SVD at [redacted]w[redacted]d. She was admitted for IOL for gHTN.   ROM: 10h 10m with clear fluid GBS Status: negative Maximum Maternal Temperature: 98.70F  Labor Progress: Initial SVE 1.5/70/-3, progressed with AROM and pitocin to fully dilated/0 station  Delivery Date/Time: 04/05/2024 1648 Delivery: Called to room and patient was complete and pushing. Head delivered ROA. Nuchal cord present, 1 loop reduced after delivery of body. Shoulder and body delivered in usual fashion. Infant with spontaneous cry, placed on mother's abdomen, dried and stimulated. Cord clamped x 2 after 1-minute delay, and cut by father of baby. Cord blood drawn. Placenta delivered spontaneously, intact, with 3-vessel cord. Fundus firm with massage and Pitocin. Labia, perineum, vagina, and cervix inspected, no lacerations found.   Placenta: Intact, delivered spontaneously Complications: None Lacerations: None EBL: Analgesia: Epidural   Infant: Viable female  APGARs 8,9  3480g  Charlie DELENA Courts, MD 04/05/2024, 5:44 PM

## 2023-08-19 ENCOUNTER — Other Ambulatory Visit (INDEPENDENT_AMBULATORY_CARE_PROVIDER_SITE_OTHER): Payer: Self-pay

## 2023-08-19 ENCOUNTER — Other Ambulatory Visit (HOSPITAL_BASED_OUTPATIENT_CLINIC_OR_DEPARTMENT_OTHER): Payer: Self-pay | Admitting: Emergency Medicine

## 2023-08-19 ENCOUNTER — Encounter (HOSPITAL_BASED_OUTPATIENT_CLINIC_OR_DEPARTMENT_OTHER): Payer: Self-pay

## 2023-08-19 ENCOUNTER — Ambulatory Visit (INDEPENDENT_AMBULATORY_CARE_PROVIDER_SITE_OTHER): Admitting: Obstetrics & Gynecology

## 2023-08-19 ENCOUNTER — Other Ambulatory Visit (HOSPITAL_BASED_OUTPATIENT_CLINIC_OR_DEPARTMENT_OTHER): Payer: Self-pay

## 2023-08-19 DIAGNOSIS — Z3A00 Weeks of gestation of pregnancy not specified: Secondary | ICD-10-CM

## 2023-08-19 DIAGNOSIS — Z3201 Encounter for pregnancy test, result positive: Secondary | ICD-10-CM | POA: Diagnosis not present

## 2023-08-19 DIAGNOSIS — I456 Pre-excitation syndrome: Secondary | ICD-10-CM

## 2023-08-19 DIAGNOSIS — O3680X0 Pregnancy with inconclusive fetal viability, not applicable or unspecified: Secondary | ICD-10-CM | POA: Diagnosis not present

## 2023-08-19 LAB — POCT URINE PREGNANCY: Preg Test, Ur: POSITIVE — AB

## 2023-08-19 NOTE — Progress Notes (Signed)
 GYNECOLOGY  VISIT  CC:   confirmation of pregnancy  HPI: 23 y.o. G1P0 Single Black or [place] American female here for confirmation of pregnancy.  She initially states her LMP was 06/08/2023 and that she was sure of that but then later thinks it was possibly at the beginning of February.  If LMP 06/08/2023, should be about 10 weeks.  Has had nausea and just hasn't felt well.  Denies vaginal bleeding.    H/o [employer] syndrome.  Outside records reviewed through Care Everywhere.  This diagnosis was made with ER visit in 2022.  She saw PCP but never saw a cardiologist when in [place].  Since being in [place], has seen Dr. Cristal Deer (08/17/2021).  Note reviewed.  Zip monitor recommended as well as lab work.  She did complete these but never missed follow up.  Has been continued on metoprolol Xl 25mg  daily but has stopped this as ran out of prescription.  Also has propranolol 10mg  tid PRN.  Not taking.  Reports she is having palpitations.  Needs RF which will be completed today.   Past Medical History:  Diagnosis Date   Wolff-Parkinson-White syndrome     MEDS:   Current Outpatient Medications on File Prior to Visit  Medication Sig Dispense Refill   metoprolol succinate (TOPROL-XL) 25 MG 24 hr tablet Take 1 tablet (25 mg total) by mouth daily. (Patient not taking: Reported on 08/19/2023) 12 tablet 0   propranolol (INDERAL) 10 MG tablet Take 1 tablet (10 mg total) by mouth 3 (three) times daily as needed. (Patient not taking: Reported on 08/19/2023) 30 tablet 0   No current facility-administered medications on file prior to visit.    ALLERGIES: Patient has no known allergies.  SH:  single, non smoker  Review of Systems  Constitutional: Negative.   Genitourinary: Negative.     PHYSICAL EXAMINATION:    LMP 06/08/2023 (Exact Date)     Physical Exam Constitutional:      Appearance: Normal appearance.  Abdominal:     General: Abdomen is flat. There is no distension.     Palpations:  Abdomen is soft. There is no mass.     Tenderness: There is no abdominal tenderness. There is no rebound.     Hernia: No hernia is present.  Neurological:     General: No focal deficit present.  Psychiatric:        Mood and Affect: Mood normal.    Limited ultrasound performed.  Single gestational sac noted measuring about 2.5cm.  No yolk sac or fetal pole seen.  No free fluid noted.  Uterus appears normal.    Chaperone, Raechel Ache, RN, was present for exam.  Assessment/Plan: 1. Encounter to determine fetal viability of pregnancy, single or unspecified fetus (Primary) - US OB Transvaginal  2. Positive pregnancy test - will check HCG level and blood type as only see gestational sac today.  Possible early pregnancy vs anembryonic pregnancy.  Plan to repeat u/s one week. - Beta hCG quant (ref lab) - ABO AND RH y  3. Wolff-Parkinson-White (WPW) syndrome - recommended pt restart metroprolol XL and will place referral to Cardiology to hopefully expedite scheduling - Ambulatory referral to Cardiology

## 2023-08-20 LAB — BETA HCG QUANT (REF LAB): hCG Quant: 54448 m[IU]/mL

## 2023-08-20 LAB — ABO AND RH: Rh Factor: POSITIVE

## 2023-08-23 ENCOUNTER — Encounter (HOSPITAL_BASED_OUTPATIENT_CLINIC_OR_DEPARTMENT_OTHER): Payer: Self-pay

## 2023-08-23 ENCOUNTER — Other Ambulatory Visit (HOSPITAL_BASED_OUTPATIENT_CLINIC_OR_DEPARTMENT_OTHER): Payer: Self-pay

## 2023-08-27 ENCOUNTER — Encounter (HOSPITAL_BASED_OUTPATIENT_CLINIC_OR_DEPARTMENT_OTHER): Payer: Self-pay

## 2023-08-27 ENCOUNTER — Ambulatory Visit (HOSPITAL_BASED_OUTPATIENT_CLINIC_OR_DEPARTMENT_OTHER)

## 2023-08-27 DIAGNOSIS — Z3A01 Less than 8 weeks gestation of pregnancy: Secondary | ICD-10-CM

## 2023-08-27 DIAGNOSIS — O3680X1 Pregnancy with inconclusive fetal viability, fetus 1: Secondary | ICD-10-CM | POA: Diagnosis not present

## 2023-08-27 DIAGNOSIS — O3680X0 Pregnancy with inconclusive fetal viability, not applicable or unspecified: Secondary | ICD-10-CM

## 2023-08-27 DIAGNOSIS — Z3491 Encounter for supervision of normal pregnancy, unspecified, first trimester: Secondary | ICD-10-CM

## 2023-08-28 ENCOUNTER — Encounter: Payer: Self-pay | Admitting: Nurse Practitioner

## 2023-08-28 ENCOUNTER — Ambulatory Visit: Attending: Nurse Practitioner | Admitting: Nurse Practitioner

## 2023-08-28 ENCOUNTER — Ambulatory Visit: Attending: Nurse Practitioner

## 2023-08-28 VITALS — BP 92/76 | HR 84 | Ht 64.0 in | Wt 113.0 lb

## 2023-08-28 DIAGNOSIS — R0602 Shortness of breath: Secondary | ICD-10-CM | POA: Diagnosis not present

## 2023-08-28 DIAGNOSIS — I456 Pre-excitation syndrome: Secondary | ICD-10-CM

## 2023-08-28 DIAGNOSIS — R002 Palpitations: Secondary | ICD-10-CM

## 2023-08-28 DIAGNOSIS — Z3201 Encounter for pregnancy test, result positive: Secondary | ICD-10-CM

## 2023-08-28 NOTE — Progress Notes (Signed)
 Office Visit    Patient Name: Hannah Esparza Date of Encounter: 08/28/2023  Primary Care Provider:  Pcp, No Primary Cardiologist:  Jodelle Red, MD  Chief Complaint    23 year old female with a history of palpitations and WPW syndrome who presents for follow-up related to palpitations.   Past Medical History    Past Medical History:  Diagnosis Date   Wolff-Parkinson-White syndrome    No past surgical history on file.  Allergies  No Known Allergies   Labs/Other Studies Reviewed    The following studies were reviewed today:     Recent Labs: No results found for requested labs within last 365 days.  Recent Lipid Panel No results found for: "CHOL", "TRIG", "HDL", "CHOLHDL", "VLDL", "LDLCALC", "LDLDIRECT"  History of Present Illness    23 year old female with the above past medical history including palpitations and WPW syndrome.  She was evaluated in the ED in [place] in 03/2021 in the setting of palpitations, dizziness, shortness of breath and chest discomfort.  EKG showed sinus rhythm with short PR interval, preexcitation, concerning for Wolff-Parkinson-White syndrome.  Treadmill test showed no evidence of reentry tachycardia.  She was started on metoprolol.  She was reevaluated in the [facility] ED in February 2023 in the setting of palpitations.  Metoprolol was restarted.  She was last seen in the office on 08/17/2021 and was stable overall from a cardiac standpoint.  She reported stable intermittent palpitations. She saw her OB/GYN on 08/19/2023 in the setting of positive pregnancy test.  Transvaginal ultrasound revealed gestational sac, possible early pregnancy versus an embryonic pregnancy.  Repeat ultrasound was recommended in 1 week.  She reported increased palpitations.  She was advised to follow-up with cardiology.  She presents today for follow-up.  Since her last visit she has been stable from a cardiac standpoint.  She reports she had an ultrasound  yesterday, which confirmed her pregnancy.  From a cardiac standpoint,  she reports intermittent palpitations that last for seconds to minutes at a time, with associated shortness of breath.  She reports symptoms 2-3 times a week.  She also notes mild shortness of breath with exertion.  BP is low in office today.  She denies any chest pain, dizziness, presyncope or syncope.  Home Medications    Current Outpatient Medications  Medication Sig Dispense Refill   Prenatal MV & Min w/FA-DHA (PRENATAL ADULT GUMMY/DHA/FA PO) Take by mouth.     metoprolol succinate (TOPROL-XL) 25 MG 24 hr tablet Take 1 tablet (25 mg total) by mouth daily. (Patient not taking: Reported on 08/28/2023) 12 tablet 0   propranolol (INDERAL) 10 MG tablet Take 1 tablet (10 mg total) by mouth 3 (three) times daily as needed. (Patient not taking: Reported on 08/28/2023) 30 tablet 0   No current facility-administered medications for this visit.     Review of Systems    She denies chest pain, pnd, orthopnea, n, v, dizziness, syncope, edema, weight gain, or early satiety. All other systems reviewed and are otherwise negative except as noted above.   Physical Exam    VS:  BP 92/76 (BP Location: Left Arm, Patient Position: Sitting, Cuff Size: Normal)   Pulse 84   Ht 5\' 4"  (1.626 m)   Wt 113 lb (51.3 kg)   LMP 07/09/2023 (Approximate)   SpO2 99%   BMI 19.40 kg/m   GEN: Well nourished, well developed, in no acute distress. HEENT: normal. Neck: Supple, no JVD, carotid bruits, or masses. Cardiac: RRR, no murmurs, rubs, or gallops. No  clubbing, cyanosis, edema.  Radials/DP/PT 2+ and equal bilaterally.  Respiratory:  Respirations regular and unlabored, clear to auscultation bilaterally. GI: Soft, nontender, nondistended, BS + x 4. MS: no deformity or atrophy. Skin: warm and dry, no rash. Neuro:  Strength and sensation are intact. Psych: Normal affect.  Accessory Clinical Findings    ECG personally reviewed by me today - EKG  Interpretation Date/Time:  Thursday August 28 2023 11:07:14 EDT Ventricular Rate:  84 PR Interval:  108 QRS Duration:  120 QT Interval:  386 QTC Calculation: 456 R Axis:   25  Text Interpretation: Normal sinus rhythm Wolff-Parkinson-White When compared with ECG of 25-Aug-2021 01:42, PREVIOUS ECG IS PRESENT Confirmed by Bernadene Person ([id number]) on 08/28/2023 11:08:58 AM  - no acute changes.   Lab Results  Component Value Date   WBC 9.2 08/25/2021   HGB 12.5 08/25/2021   HCT 36.6 08/25/2021   MCV 91.3 08/25/2021   PLT 230 08/25/2021   Lab Results  Component Value Date   CREATININE 0.97 08/25/2021   BUN 8 08/25/2021   NA 138 08/25/2021   K 3.8 08/25/2021   CL 109 08/25/2021   CO2 21 (L) 08/25/2021   No results found for: "ALT", "AST", "GGT", "ALKPHOS", "BILITOT" No results found for: "CHOL", "HDL", "LDLCALC", "LDLDIRECT", "TRIG", "CHOLHDL"  No results found for: "HGBA1C"  Assessment & Plan    1. Palpitations/WPW/shortness of breath: Diagnosed in 2022. EKG showed sinus rhythm with short PR interval, preexcitation, concerning for Wolff-Parkinson-White syndrome. Treadmill test in 2022 showed no evidence of reentry tachycardia. Recent increase in palpitations with associated shortness of breath.  Will check echocardiogram, 14-day Zio, CBC, BMET, TSH and Mg. Reviewed ED precautions. She is not interested in continuing metoprolol or propranolol at this time. Advised her to notify us of worsening symptoms.  BP is borderline low in office today, she is asymptomatic.  Will refer to EP for ongoing management.   2. Positive pregnancy test: Transvaginal ultrasound revealed gestational sac, possible early pregnancy versus an embryonic pregnancy.  Repeat ultrasound confirmed pregnancy.  Following with OB.  3. Disposition: Refer to EP in the setting of WPW, follow-up in 3 to 4 months with Dr. Cristal Deer.      Joylene Grapes, NP 08/28/2023, 11:10 AM

## 2023-08-28 NOTE — Progress Notes (Unsigned)
Enrolled for [employer] to mail a ZIO XT long term holter monitor to the patients address on file.   Dr. Harrell Gave to read.

## 2023-08-28 NOTE — Patient Instructions (Addendum)
 Medication Instructions:  Stop Metoprolol 25 mg  Stop Propranolol 10 mg  *If you need a refill on your cardiac medications before your next appointment, please call your pharmacy*   Lab Work: BMET, CBC, TSH, Magnesium today   Testing/Procedures: Your physician has requested that you have an echocardiogram. Echocardiography is a painless test that uses sound waves to create images of your heart. It provides your doctor with information about the size and shape of your heart and how well your heart's chambers and valves are working. This procedure takes approximately one hour. There are no restrictions for this procedure. Please do NOT wear cologne, perfume, aftershave, or lotions (deodorant is allowed). Please arrive 15 minutes prior to your appointment time.  Please note: We ask at that you not bring children with you during ultrasound (echo/ vascular) testing. Due to room size and safety concerns, children are not allowed in the ultrasound rooms during exams. Our front office staff cannot provide observation of children in our lobby area while testing is being conducted. An adult accompanying a patient to their appointment will only be allowed in the ultrasound room at the discretion of the ultrasound technician under special circumstances. We apologize for any inconvenience.  ZIO XT- Long Term Monitor Instructions  Your physician has requested you wear a ZIO patch monitor for 14 days.  This is a single patch monitor. [employer] supplies one patch monitor per enrollment. Additional stickers are not available. Please do not apply patch if you will be having a Nuclear Stress Test,  Echocardiogram, Cardiac CT, MRI, or Chest Xray during the period you would be wearing the  monitor. The patch cannot be worn during these tests. You cannot remove and re-apply the  ZIO XT patch monitor.  Your ZIO patch monitor will be mailed 3 day USPS to your address on file. It may take 3-5 days  to receive your  monitor after you have been enrolled.  Once you have received your monitor, please review the enclosed instructions. Your monitor  has already been registered assigning a specific monitor serial # to you.  Billing and Patient Assistance Program Information  We have supplied [employer] with any of your insurance information on file for billing purposes. [employer] offers a sliding scale Patient Assistance Program for patients that do not have  insurance, or whose insurance does not completely cover the cost of the ZIO monitor.  You must apply for the Patient Assistance Program to qualify for this discounted rate.  To apply, please call [employer] at [phone], select option 4, select option 2, ask to apply for  Patient Assistance Program. Meredeth Ide will ask your household income, and how many people  are in your household. They will quote your out-of-pocket cost based on that information.  [employer] will also be able to set up a 40-month, interest-free payment plan if needed.  Applying the monitor   Shave hair from upper left chest.  Hold abrader disc by orange tab. Rub abrader in 40 strokes over the upper left chest as  indicated in your monitor instructions.  Clean area with 4 enclosed alcohol pads. Let dry.  Apply patch as indicated in monitor instructions. Patch will be placed under collarbone on left  side of chest with arrow pointing upward.  Rub patch adhesive wings for 2 minutes. Remove white label marked "1". Remove the white  label marked "2". Rub patch adhesive wings for 2 additional minutes.  While looking in a mirror, press and release button in center of patch.  A small green light will  flash 3-4 times. This will be your only indicator that the monitor has been turned on.  Do not shower for the first 24 hours. You may shower after the first 24 hours.  Press the button if you feel a symptom. You will hear a small click. Record Date, Time and  Symptom in the Patient Logbook.  When you  are ready to remove the patch, follow instructions on the last 2 pages of Patient  Logbook. Stick patch monitor onto the last page of Patient Logbook.  Place Patient Logbook in the blue and white box. Use locking tab on box and tape box closed  securely. The blue and white box has prepaid postage on it. Please place it in the mailbox as  soon as possible. Your physician should have your test results approximately 7 days after the  monitor has been mailed back to [facility].  Call [facility] Customer Care at [phone] if you have questions regarding  your ZIO XT patch monitor. Call them immediately if you see an orange light blinking on your  monitor.  If your monitor falls off in less than 4 days, contact our Monitor department at [phone].  If your monitor becomes loose or falls off after 4 days call [employer] at [phone] for  suggestions on securing your monitor    Follow-Up: At [facility], you and your health needs are our priority.  As part of our continuing mission to provide you with exceptional heart care, we have created designated Provider Care Teams.  These Care Teams include your primary Cardiologist (physician) and Advanced Practice Providers (APPs -  Physician Assistants and Nurse Practitioners) who all work together to provide you with the care you need, when you need it.  We recommend signing up for the patient portal called "MyChart".  Sign up information is provided on this After Visit Summary.  MyChart is used to connect with patients for Virtual Visits (Telemedicine).  Patients are able to view lab/test results, encounter notes, upcoming appointments, etc.  Non-urgent messages can be sent to your provider as well.   To learn more about what you can do with MyChart, go to [email or web address].    Your next appointment:   3-4 month(s)  Provider:   Jodelle Red, MD    Other Instructions   1st Floor: - Lobby - Registration  -  Pharmacy  - Lab - Cafe  2nd Floor: - PV Lab - Diagnostic Testing (echo, CT, nuclear med)  3rd Floor: - Vacant  4th Floor: - TCTS (cardiothoracic surgery) - AFib Clinic - Structural Heart Clinic - Vascular Surgery  - Vascular Ultrasound  5th Floor: - [facility] Card[facility] (general and EP) - Clinical Pharmacy for coumadin, hypertension, lipid, weight-loss medications, and med management appointments    Valet parking services will be available as well.

## 2023-08-29 LAB — CBC
Hematocrit: 43.6 % (ref 34.0–46.6)
Hemoglobin: 14.5 g/dL (ref 11.1–15.9)
MCH: 30.5 pg (ref 26.6–33.0)
MCHC: 33.3 g/dL (ref 31.5–35.7)
MCV: 92 fL (ref 79–97)
Platelets: 306 10*3/uL (ref 150–450)
RBC: 4.76 x10E6/uL (ref 3.77–5.28)
RDW: 14.2 % (ref 11.7–15.4)
WBC: 9 10*3/uL (ref 3.4–10.8)

## 2023-08-29 LAB — BASIC METABOLIC PANEL WITH GFR
BUN/Creatinine Ratio: 10 (ref 9–23)
BUN: 8 mg/dL (ref 6–20)
CO2: 17 mmol/L — ABNORMAL LOW (ref 20–29)
Calcium: 10.3 mg/dL — ABNORMAL HIGH (ref 8.7–10.2)
Chloride: 98 mmol/L (ref 96–106)
Creatinine, Ser: 0.78 mg/dL (ref 0.57–1.00)
Glucose: 62 mg/dL — ABNORMAL LOW (ref 70–99)
Potassium: 4.2 mmol/L (ref 3.5–5.2)
Sodium: 134 mmol/L (ref 134–144)
eGFR: 110 mL/min/{1.73_m2} (ref >59)

## 2023-08-29 LAB — MAGNESIUM: Magnesium: 2.2 mg/dL (ref 1.6–2.3)

## 2023-08-29 LAB — TSH: TSH: 0.968 u[IU]/mL (ref 0.450–4.500)

## 2023-09-04 ENCOUNTER — Telehealth: Payer: Self-pay

## 2023-09-04 NOTE — Telephone Encounter (Addendum)
 Results viewed by patient via Mychart.----- Message from Joylene Grapes sent at 09/02/2023  6:51 AM EDT ----- Recent labs show stable kidney function and electrolytes, stable blood counts, magnesium level and thyroid function were normal. Follow-up with electrophysiology as planned.  Thank you-EM

## 2023-09-17 ENCOUNTER — Encounter (HOSPITAL_BASED_OUTPATIENT_CLINIC_OR_DEPARTMENT_OTHER): Payer: Self-pay | Admitting: *Deleted

## 2023-09-17 ENCOUNTER — Ambulatory Visit (HOSPITAL_BASED_OUTPATIENT_CLINIC_OR_DEPARTMENT_OTHER): Admitting: *Deleted

## 2023-09-17 ENCOUNTER — Other Ambulatory Visit (HOSPITAL_COMMUNITY)
Admission: RE | Admit: 2023-09-17 | Discharge: 2023-09-17 | Disposition: A | Discharge status: Home / Self Care | Source: Ambulatory Visit | Attending: Certified Nurse Midwife | Admitting: Certified Nurse Midwife

## 2023-09-17 VITALS — BP 123/81 | HR 85 | Wt 121.8 lb

## 2023-09-17 DIAGNOSIS — Z3A10 10 weeks gestation of pregnancy: Secondary | ICD-10-CM

## 2023-09-17 DIAGNOSIS — Z3401 Encounter for supervision of normal first pregnancy, first trimester: Secondary | ICD-10-CM | POA: Insufficient documentation

## 2023-09-17 MED ORDER — GOJJI WEIGHT SCALE MISC: 1.0000 | Freq: Once | 0 refills | Status: AC

## 2023-09-17 MED ORDER — BLOOD PRESSURE KIT DEVI: 1.0000 | Freq: Once | 0 refills | Status: DC

## 2023-09-17 NOTE — Progress Notes (Signed)
 New OB Intake  I explained I am completing New OB Intake today. We discussed EDD of 04/14/2024, by Last Menstrual Period. Pt is G1P0. I reviewed her allergies, medications and Medical/Surgical/OB history.    Patient Active Problem List   Diagnosis Date Noted   Wolff-Parkinson-White syndrome 03/06/2021   Viral warts 06/07/2008    Concerns addressed today  Delivery Plans Plans to deliver at [facility]. Discussed the nature of our practice with multiple providers including residents and students. Due to the size of the practice, the delivering provider may not be the same as those providing prenatal care.   Patient is not interested in water birth. Offered upcoming OB visit with CNM to discuss further.  MyChart/Babyscripts MyChart access verified. I explained pt will have some visits in office and some virtually. Babyscripts instructions given and order placed. Patient verifies receipt of registration text/e-mail. Account successfully created and app downloaded. If patient is a candidate for Optimized scheduling, add to sticky note.   Blood Pressure Cuff/Weight Scale Blood pressure cuff ordered for patient to pick-up from [employer]. Explained after first prenatal appt pt will check weekly and document in Babyscripts. Patient does not have weight scale; order sent to [facility], patient may track weight weekly in Babyscripts.  Anatomy US  Explained first scheduled US  will be around 19 weeks. Anatomy US  scheduled for 11/19/23 at 0930.  Is patient a CenteringPregnancy candidate?  Not a Candidate Not a candidate due to Complex coordination of care needed If accepted,    Is patient a Mom+Baby Combined Care candidate?  Not a candidate   If accepted, confirm patient does not intend to move from the area for at least 12 months, then notify Mom+Baby staff  Interested in [place]? If yes, send referral and doula dot phrase.   Is patient a candidate for Babyscripts Optimization? Yes,  patient declined   First visit review I reviewed new OB appt with patient. Explained pt will be seen by Dr.Miller at first visit. Discussed [place] genetic screening with patient. Pt would like both Panorama and Horizon and would like to know gender. Routine prenatal labs  collected.    Last Pap Will need at new OB visit.  Marliss Simple, RN 09/17/2023  1:16 PM

## 2023-09-18 ENCOUNTER — Encounter (HOSPITAL_BASED_OUTPATIENT_CLINIC_OR_DEPARTMENT_OTHER): Payer: Self-pay | Admitting: Certified Nurse Midwife

## 2023-09-18 ENCOUNTER — Ambulatory Visit: Attending: Internal Medicine | Admitting: Internal Medicine

## 2023-09-18 DIAGNOSIS — Z3490 Encounter for supervision of normal pregnancy, unspecified, unspecified trimester: Secondary | ICD-10-CM | POA: Diagnosis not present

## 2023-09-18 LAB — HEPATITIS B SURFACE ANTIGEN: Hepatitis B Surface Ag: NEGATIVE

## 2023-09-18 LAB — CERVICOVAGINAL ANCILLARY ONLY
Chlamydia: NEGATIVE
Comment: NEGATIVE
Comment: NORMAL
Neisseria Gonorrhea: NEGATIVE

## 2023-09-18 LAB — HEMOGLOBIN A1C
Est. average glucose Bld gHb Est-mCnc: 103 mg/dL
Hgb A1c MFr Bld: 5.2 % (ref 4.8–5.6)

## 2023-09-18 LAB — ANTIBODY SCREEN: Antibody Screen: NEGATIVE

## 2023-09-18 LAB — HIV ANTIBODY (ROUTINE TESTING W REFLEX): HIV Screen 4th Generation wRfx: NONREACTIVE

## 2023-09-18 LAB — HEPATITIS C ANTIBODY: Hep C Virus Ab: NONREACTIVE

## 2023-09-18 LAB — CBC
Hematocrit: 34.4 % (ref 34.0–46.6)
Hemoglobin: 11.6 g/dL (ref 11.1–15.9)
MCH: 31.3 pg (ref 26.6–33.0)
MCHC: 33.7 g/dL (ref 31.5–35.7)
MCV: 93 fL (ref 79–97)
Platelets: 236 10*3/uL (ref 150–450)
RBC: 3.71 x10E6/uL — ABNORMAL LOW (ref 3.77–5.28)
RDW: 14.5 % (ref 11.7–15.4)
WBC: 9.2 10*3/uL (ref 3.4–10.8)

## 2023-09-18 LAB — RPR: RPR Ser Ql: NONREACTIVE

## 2023-09-18 LAB — RUBELLA SCREEN: Rubella Antibodies, IGG: 2.52 {index} (ref >0.99)

## 2023-09-19 LAB — URINE CULTURE

## 2023-09-24 ENCOUNTER — Encounter (HOSPITAL_BASED_OUTPATIENT_CLINIC_OR_DEPARTMENT_OTHER): Payer: Self-pay | Admitting: *Deleted

## 2023-09-24 ENCOUNTER — Ambulatory Visit: Admitting: Emergency Medicine

## 2023-09-24 ENCOUNTER — Ambulatory Visit (HOSPITAL_COMMUNITY)
Admission: RE | Admit: 2023-09-24 | Source: Ambulatory Visit | Attending: Nurse Practitioner | Admitting: Nurse Practitioner

## 2023-09-24 DIAGNOSIS — Z3400 Encounter for supervision of normal first pregnancy, unspecified trimester: Secondary | ICD-10-CM | POA: Insufficient documentation

## 2023-09-24 LAB — PANORAMA PRENATAL TEST FULL PANEL:PANORAMA TEST PLUS 5 ADDITIONAL MICRODELETIONS: FETAL FRACTION: 8.6

## 2023-09-24 LAB — HORIZON CUSTOM: REPORT SUMMARY: NEGATIVE

## 2023-09-25 ENCOUNTER — Other Ambulatory Visit (HOSPITAL_BASED_OUTPATIENT_CLINIC_OR_DEPARTMENT_OTHER): Payer: Self-pay | Admitting: Certified Nurse Midwife

## 2023-09-25 DIAGNOSIS — Z3401 Encounter for supervision of normal first pregnancy, first trimester: Secondary | ICD-10-CM

## 2023-10-01 ENCOUNTER — Other Ambulatory Visit (HOSPITAL_BASED_OUTPATIENT_CLINIC_OR_DEPARTMENT_OTHER): Payer: Self-pay | Admitting: Obstetrics & Gynecology

## 2023-10-01 ENCOUNTER — Encounter (HOSPITAL_BASED_OUTPATIENT_CLINIC_OR_DEPARTMENT_OTHER): Payer: Self-pay | Admitting: Obstetrics & Gynecology

## 2023-10-01 ENCOUNTER — Ambulatory Visit (HOSPITAL_BASED_OUTPATIENT_CLINIC_OR_DEPARTMENT_OTHER): Admitting: Obstetrics & Gynecology

## 2023-10-01 ENCOUNTER — Other Ambulatory Visit (HOSPITAL_COMMUNITY)
Admission: RE | Admit: 2023-10-01 | Discharge: 2023-10-01 | Disposition: A | Discharge status: Home / Self Care | Source: Ambulatory Visit | Attending: Obstetrics & Gynecology | Admitting: Obstetrics & Gynecology

## 2023-10-01 VITALS — BP 116/74 | HR 88 | Ht 64.0 in | Wt 122.0 lb

## 2023-10-01 DIAGNOSIS — Z124 Encounter for screening for malignant neoplasm of cervix: Secondary | ICD-10-CM | POA: Diagnosis not present

## 2023-10-01 DIAGNOSIS — M5431 Sciatica, right side: Secondary | ICD-10-CM

## 2023-10-01 DIAGNOSIS — I456 Pre-excitation syndrome: Secondary | ICD-10-CM | POA: Diagnosis not present

## 2023-10-01 DIAGNOSIS — Z3A12 12 weeks gestation of pregnancy: Secondary | ICD-10-CM

## 2023-10-01 DIAGNOSIS — Z3401 Encounter for supervision of normal first pregnancy, first trimester: Secondary | ICD-10-CM | POA: Diagnosis not present

## 2023-10-01 MED ORDER — ASPIRIN 81 MG PO TBEC: 81.0000 mg | DELAYED_RELEASE_TABLET | Freq: Every day | ORAL | Status: DC

## 2023-10-03 ENCOUNTER — Encounter (HOSPITAL_BASED_OUTPATIENT_CLINIC_OR_DEPARTMENT_OTHER): Payer: Self-pay | Admitting: Obstetrics & Gynecology

## 2023-10-03 DIAGNOSIS — M5431 Sciatica, right side: Secondary | ICD-10-CM | POA: Insufficient documentation

## 2023-10-03 NOTE — Progress Notes (Signed)
 History:   Hannah Esparza is a 23 y.o. G1P0 at [redacted]w[redacted]d by LMP being seen today for her first obstetrical visit.  Her obstetrical history is significant for  h/o [employer] syndrome. Pt was referred to cardiology but didn't make appt.  Had transportation issues.  Understands importance.  Lives in [place] so seeing ob cardiology would be more convenient.  Message will be sent to Dr. Emmette Harms for scheduling . Patient does intend to breast feed. Pregnancy history fully reviewed.  Patient reports she is having pain in lower back on right side with radiation into right hip/buttocks.  Does have pain with walking.  Sports medicine referral discussed.      HISTORY: OB History  Gravida Para Term Preterm AB Living  1 0 0 0 0 0  SAB IAB Ectopic Multiple Live Births  0 0 0 0 0    # Outcome Date GA Lbr Len/2nd Weight Sex Type Anes PTL Lv  1 Current             Last pap smear is due.  Past Medical History:  Diagnosis Date   Wolff-Parkinson-White syndrome    History reviewed. No pertinent surgical history. No family history on file. Social History   Tobacco Use   Smoking status: Former    Current packs/day: 0.00    Types: Cigarettes    Quit date: 08/16/2023    Years since quitting: 0.1    Passive exposure: Yes   Smokeless tobacco: Never  Vaping Use   Vaping status: Never Used  Substance Use Topics   Alcohol use: No   Drug use: No   No Known Allergies Current Outpatient Medications on File Prior to Visit  Medication Sig Dispense Refill   Prenatal MV & Min w/FA-DHA (PRENATAL ADULT GUMMY/DHA/FA PO) Take by mouth.     No current facility-administered medications on file prior to visit.    Review of Systems Pertinent items noted in HPI and remainder of comprehensive ROS otherwise negative.  Physical Exam:   Vitals:   10/01/23 0913  BP: 116/74  Pulse: 88  Weight: 122 lb (55.3 kg)   Fetal Heart Rate (bpm): 150  Patient informed that the ultrasound is considered a limited  obstetric ultrasound and is not intended to be a complete ultrasound exam.  Patient also informed that the ultrasound is not being completed with the intent of assessing for fetal or placental anomalies or any pelvic abnormalities.  Explained that the purpose of today's ultrasound is to assess for fetal heart rate.  Patient acknowledges the purpose of the exam and the limitations of the study.  General: well-developed, well-nourished female in no acute distress  Breasts:  normal appearance, no masses or tenderness bilaterally  Skin: normal coloration and turgor, no rashes  Neurologic: oriented, normal, negative, normal mood  Extremities: normal strength, tone, and muscle mass, ROM of all joints is normal  HEENT PERRLA, extraocular movement intact and sclera clear, anicteric  Neck supple and no masses  Cardiovascular: regular rate and rhythm  Respiratory:  no respiratory distress, normal breath sounds  Abdomen: soft, non-tender; bowel sounds normal; no masses,  no organomegaly  Pelvic: normal external genitalia, no lesions, normal vaginal mucosa, normal vaginal discharge, normal cervix, pap smear done. Uterine size:  12 weeks, soft    Assessment:    Pregnancy: G1P0 Patient Active Problem List   Diagnosis Date Noted   Supervision of normal first pregnancy 09/24/2023   Wolff-Parkinson-White syndrome 03/06/2021   Viral warts 06/07/2008  Plan:    1. Encounter for supervision of normal first pregnancy in first trimester (Primary) - on PNV - aspirin  EC 81 MG tablet; Take 1 tablet (81 mg total) by mouth daily. Swallow whole.  Pt to start at 13 weeks - recheck 4 weeks  2. [redacted] weeks gestation of pregnancy  3. Sciatica of right side - AMB referral to sports medicine  4. Cervical cancer screening - Cytology - PAP( [place])  5. Wolff-Parkinson-White syndrome - referral to ob cardiology placed today and message sent to Dr. Emmette Harms to see if pt needs any immediate management   Initial  labs drawn. Continue prenatal vitamins. Problem list reviewed and updated. Genetic Screening results discussed. Ultrasound discussed; fetal anatomic survey: ordered. Anticipatory guidance about prenatal visits given including labs, ultrasounds, and testing. Discussed usage of Babyscripts and virtual visits as additional source of managing and completing prenatal visits in midst of coronavirus and pandemic.   Encouraged to complete MyChart Registration for her ability to review results, send requests, and have questions addressed.  The nature of  - [facility] for [facility] Healthcare/Faculty Practice with multiple MDs and Advanced Practice Providers was explained to patient; also emphasized that residents, students are part of our team. Routine obstetric precautions reviewed. Encouraged to seek out care at office or emergency room [facility] preferred) for urgent and/or emergent concerns. Return in about 4 weeks (around 10/29/2023).     Ammie Bale, MD, FACOG Obstetrician & Gynecologist, [facility], [facility]

## 2023-10-06 LAB — CYTOLOGY - PAP
Diagnosis: NEGATIVE
Diagnosis: REACTIVE

## 2023-10-07 NOTE — Progress Notes (Unsigned)
   Ben Jackson D.[facility] [address] [place] [address] Phone: [phone]   Assessment and Plan:     There are no diagnoses linked to this encounter.  *** - Patient has received relief with OMT in the past.  Elects for repeat OMT today.  Tolerated well per note below. - Decision today to treat with OMT was based on Physical Exam   After verbal consent patient was treated with HVLA (high velocity low amplitude), ME (muscle energy), FPR (flex positional release), ST (soft tissue), PC/PD (Pelvic Compression/ Pelvic Decompression) techniques in cervical, rib, thoracic, lumbar, and pelvic areas. Patient tolerated the procedure well with improvement in symptoms.  Patient educated on potential side effects of soreness and recommended to rest, hydrate, and use Tylenol  as needed for pain control.   Pertinent previous records reviewed include ***    Follow Up: ***     Subjective:   I, Hannah Esparza, am serving as a Neurosurgeon for Doctor Ulysees Gander  Chief Complaint: right side low back   HPI:   10/08/2023 Patient is a 23 year old female with right side low back pain. Patient states   Relevant Historical Information: ***  Additional pertinent review of systems negative.  Current Outpatient Medications  Medication Sig Dispense Refill   aspirin  EC 81 MG tablet Take 1 tablet (81 mg total) by mouth daily. Swallow whole.     Prenatal MV & Min w/FA-DHA (PRENATAL ADULT GUMMY/DHA/FA PO) Take by mouth.     No current facility-administered medications for this visit.      Objective:     There were no vitals filed for this visit.    There is no height or weight on file to calculate BMI.    Physical Exam:     General: Well-appearing, cooperative, sitting comfortably in no acute distress.   OMT Physical Exam:  ASIS Compression Test: Positive Right Cervical: TTP paraspinal, *** Rib: Bilateral elevated first rib with TTP Thoracic: TTP  paraspinal,*** Lumbar: TTP paraspinal,*** Pelvis: Right anterior innominate  Electronically signed by:  Marshall Skeeter D.[facility]rel[facility] 7:38 AM 10/07/23

## 2023-10-08 ENCOUNTER — Ambulatory Visit: Admitting: Sports Medicine

## 2023-10-08 VITALS — BP 108/82 | HR 103 | Ht 64.0 in | Wt 125.0 lb

## 2023-10-08 DIAGNOSIS — Z3A13 13 weeks gestation of pregnancy: Secondary | ICD-10-CM

## 2023-10-08 DIAGNOSIS — M9905 Segmental and somatic dysfunction of pelvic region: Secondary | ICD-10-CM | POA: Diagnosis not present

## 2023-10-08 DIAGNOSIS — M545 Low back pain, unspecified: Secondary | ICD-10-CM

## 2023-10-08 DIAGNOSIS — M9903 Segmental and somatic dysfunction of lumbar region: Secondary | ICD-10-CM

## 2023-10-08 DIAGNOSIS — M9904 Segmental and somatic dysfunction of sacral region: Secondary | ICD-10-CM

## 2023-10-08 NOTE — Patient Instructions (Signed)
 Low back HEP  Tylenol  as needed  for pain relief  4 week follow up

## 2023-10-14 ENCOUNTER — Encounter: Payer: Self-pay | Admitting: Nurse Practitioner

## 2023-10-28 ENCOUNTER — Ambulatory Visit (HOSPITAL_BASED_OUTPATIENT_CLINIC_OR_DEPARTMENT_OTHER): Payer: Self-pay | Admitting: Obstetrics & Gynecology

## 2023-10-28 ENCOUNTER — Other Ambulatory Visit (HOSPITAL_BASED_OUTPATIENT_CLINIC_OR_DEPARTMENT_OTHER): Payer: Self-pay | Admitting: *Deleted

## 2023-10-28 MED ORDER — METRONIDAZOLE 500 MG PO TABS: 500.0000 mg | ORAL_TABLET | Freq: Two times a day (BID) | ORAL | 0 refills | Status: DC

## 2023-10-28 NOTE — Progress Notes (Signed)
 Rx sent to pharmacy for treatment of +BV on PAP

## 2023-10-29 ENCOUNTER — Ambulatory Visit: Payer: Self-pay | Admitting: Nurse Practitioner

## 2023-10-29 ENCOUNTER — Encounter (HOSPITAL_BASED_OUTPATIENT_CLINIC_OR_DEPARTMENT_OTHER): Admitting: Certified Nurse Midwife

## 2023-10-29 DIAGNOSIS — R002 Palpitations: Secondary | ICD-10-CM | POA: Diagnosis not present

## 2023-11-04 ENCOUNTER — Ambulatory Visit (HOSPITAL_BASED_OUTPATIENT_CLINIC_OR_DEPARTMENT_OTHER): Admitting: Certified Nurse Midwife

## 2023-11-04 VITALS — BP 129/71 | HR 90 | Wt 128.0 lb

## 2023-11-04 DIAGNOSIS — Z3401 Encounter for supervision of normal first pregnancy, first trimester: Secondary | ICD-10-CM

## 2023-11-04 NOTE — Progress Notes (Unsigned)
    PRENATAL VISIT NOTE  Subjective:  Hannah Esparza is a 23 y.o. G1P0 at [redacted]w[redacted]d being seen today for ongoing prenatal care.    She is currently monitored for the following issues for this low-risk pregnancy and has Viral warts; Wolff-Parkinson-White syndrome; Supervision of normal first pregnancy; and Sciatica of right side on their problem list.  Patient reports no bleeding, no contractions, no cramping, and no leaking.  Contractions: Not present. Vag. Bleeding: None.  Movement: Absent. Denies leaking of fluid.   The following portions of the patient's history were reviewed and updated as appropriate: allergies, current medications, past family history, past medical history, past social history, past surgical history and problem list.   Objective:    Vitals:   11/04/23 1410  BP: 129/71  Pulse: 90  Weight: 128 lb (58.1 kg)    Fetal Status:      Movement: Absent    General: Alert, oriented and cooperative. Patient is in no acute distress.  Skin: Skin is warm and dry. No rash noted.   Cardiovascular: Normal heart rate noted  Respiratory: Normal respiratory effort, no problems with respiration noted  Abdomen: Soft, gravid, appropriate for gestational age.  Pain/Pressure: Absent     Pelvic: Cervical exam deferred        Extremities: Normal range of motion.  Edema: None  Mental Status: Normal mood and affect. Normal behavior. Normal judgment and thought content.   Assessment and Plan:  Pregnancy: G1P0 at [redacted]w[redacted]d 1. Encounter for supervision  of normal first pregnancy in first trimester (Primary) - Panorama Low-Risk - Pt taking PNV. Encouraged her to start ASA 81mg  po once daily (has not started yet) - AFP, Serum, Open Spina Bifida  Preterm labor symptoms and general obstetric precautions including but not limited to vaginal bleeding, contractions, leaking of fluid and fetal movement were reviewed in detail with the patient. Please refer to After Visit Summary for other counseling  recommendations.   No follow-ups on file.  Future Appointments  Date Time Provider Department Center  11/05/2023  3:30 PM Ulysees Gander, DO [facility] None  11/19/2023  9:00 AM [facility] PROVIDER 1 [facility]  11/19/2023  9:30 AM [facility] US2 [facility]  11/26/2023  9:55 AM Pranav Lince, Juvenal Opoka, CNM [facility]  12/12/2023  3:20 PM Tobb, Chyrl Crawford, DO [facility] None  12/17/2023 11:00 AM Sheryle Donning, MD [facility]  12/23/2023  2:35 PM Zelma Hidden, FNP [facility]  01/15/2024  8:35 AM Sohana Austell, Juvenal Opoka, CNM [facility]  02/04/2024  9:35 AM Eason Housman, Juvenal Opoka, CNM [facility]  02/18/2024  9:15 AM Lillian Rein, MD [facility]  03/03/2024  9:15 AM Elizabet Schweppe, Juvenal Opoka, CNM [facility]  03/17/2024  9:15 AM Lillian Rein, MD [facility]  03/24/2024  9:15 AM Hilda Lovings, Juvenal Opoka, CNM [facility]  03/31/2024  9:15 AM Lillian Rein, MD [facility]  04/07/2024  9:35 AM Donell Sliwinski, Juvenal Opoka, CNM [facility]  04/14/2024  9:35 AM Romeo Zielinski, Juvenal Opoka, CNM [facility]    Yolanda Hence, CNM

## 2023-11-04 NOTE — Progress Notes (Unsigned)
   Ben Jackson D.Arelia [facility] [address] [place] [address] Phone: [phone]   Assessment and Plan:     There are no diagnoses linked to this encounter.  *** - Patient has received relief with OMT in the past.  Elects for repeat OMT today.  Tolerated well per note below. - Decision today to treat with OMT was based on Physical Exam   After verbal consent patient was treated with HVLA (high velocity low amplitude), ME (muscle energy), FPR (flex positional release), ST (soft tissue), PC/PD (Pelvic Compression/ Pelvic Decompression) techniques in cervical, rib, thoracic, lumbar, and pelvic areas. Patient tolerated the procedure well with improvement in symptoms.  Patient educated on potential side effects of soreness and recommended to rest, hydrate, and use Tylenol  as needed for pain control.   Pertinent previous records reviewed include ***    Follow Up: ***     Subjective:   I, Kara Melching, am serving as a Neurosurgeon for Doctor Ulysees Gander  Chief Complaint: right side low back    HPI:    10/08/2023 Patient is a 23 year old female with right side low back pain. Patient states [redacted] weeks pregnant . Tailbone , low back and hip pain. Pain for about a week. 1/2 500 mg of tylenol  for the pain. No numbness or tingling.    11/05/2023 Patient states   Relevant Historical Information: None pertinent  Additional pertinent review of systems negative.  Current Outpatient Medications  Medication Sig Dispense Refill   aspirin  EC 81 MG tablet Take 1 tablet (81 mg total) by mouth daily. Swallow whole.     metroNIDAZOLE  (FLAGYL ) 500 MG tablet Take 1 tablet (500 mg total) by mouth 2 (two) times daily. 14 tablet 0   Prenatal MV & Min w/FA-DHA (PRENATAL ADULT GUMMY/DHA/FA PO) Take by mouth.     No current facility-administered medications for this visit.      Objective:     There were no vitals filed for this visit.    There is no height or weight on file to  calculate BMI.    Physical Exam:     General: Well-appearing, cooperative, sitting comfortably in no acute distress.   OMT Physical Exam:  ASIS Compression Test: Positive Right Cervical: TTP paraspinal, *** Rib: Bilateral elevated first rib with TTP Thoracic: TTP paraspinal,*** Lumbar: TTP paraspinal,*** Pelvis: Right anterior innominate  Electronically signed by:  Marshall Skeeter D.[facility] 7:53 AM 11/04/23

## 2023-11-05 ENCOUNTER — Ambulatory Visit: Admitting: Sports Medicine

## 2023-11-05 VITALS — BP 128/78 | HR 81 | Ht 64.0 in | Wt 130.0 lb

## 2023-11-05 DIAGNOSIS — M9903 Segmental and somatic dysfunction of lumbar region: Secondary | ICD-10-CM

## 2023-11-05 DIAGNOSIS — M9905 Segmental and somatic dysfunction of pelvic region: Secondary | ICD-10-CM

## 2023-11-05 DIAGNOSIS — M9904 Segmental and somatic dysfunction of sacral region: Secondary | ICD-10-CM

## 2023-11-05 DIAGNOSIS — Z3A17 17 weeks gestation of pregnancy: Secondary | ICD-10-CM | POA: Diagnosis not present

## 2023-11-05 DIAGNOSIS — M545 Low back pain, unspecified: Secondary | ICD-10-CM

## 2023-11-06 ENCOUNTER — Ambulatory Visit (HOSPITAL_BASED_OUTPATIENT_CLINIC_OR_DEPARTMENT_OTHER): Payer: Self-pay | Admitting: Certified Nurse Midwife

## 2023-11-06 LAB — AFP, SERUM, OPEN SPINA BIFIDA
AFP MoM: 1.46
AFP Value: 67.6 ng/mL
Gest. Age on Collection Date: 17 wk
Maternal Age At EDD: 23.4 a
OSBR Risk 1 IN: 6095
Test Results:: NEGATIVE
Weight: 128 [lb_av]

## 2023-11-14 ENCOUNTER — Encounter (HOSPITAL_COMMUNITY): Payer: Self-pay | Admitting: Obstetrics and Gynecology

## 2023-11-14 ENCOUNTER — Other Ambulatory Visit (HOSPITAL_BASED_OUTPATIENT_CLINIC_OR_DEPARTMENT_OTHER): Payer: Self-pay

## 2023-11-14 ENCOUNTER — Inpatient Hospital Stay (HOSPITAL_COMMUNITY)
Admission: AD | Admit: 2023-11-14 | Discharge: 2023-11-14 | Disposition: A | Discharge status: Home / Self Care | Attending: Obstetrics and Gynecology | Admitting: Obstetrics and Gynecology

## 2023-11-14 ENCOUNTER — Other Ambulatory Visit: Payer: Self-pay

## 2023-11-14 DIAGNOSIS — O26892 Other specified pregnancy related conditions, second trimester: Secondary | ICD-10-CM | POA: Diagnosis not present

## 2023-11-14 DIAGNOSIS — R102 Pelvic and perineal pain: Secondary | ICD-10-CM | POA: Diagnosis not present

## 2023-11-14 DIAGNOSIS — R1032 Left lower quadrant pain: Secondary | ICD-10-CM | POA: Diagnosis not present

## 2023-11-14 DIAGNOSIS — Z3A18 18 weeks gestation of pregnancy: Secondary | ICD-10-CM | POA: Diagnosis not present

## 2023-11-14 DIAGNOSIS — O26899 Other specified pregnancy related conditions, unspecified trimester: Secondary | ICD-10-CM

## 2023-11-14 DIAGNOSIS — Z3492 Encounter for supervision of normal pregnancy, unspecified, second trimester: Secondary | ICD-10-CM

## 2023-11-14 LAB — URINALYSIS, ROUTINE W REFLEX MICROSCOPIC
Bilirubin Urine: NEGATIVE
Glucose, UA: NEGATIVE mg/dL
Hgb urine dipstick: NEGATIVE
Ketones, ur: NEGATIVE mg/dL
Leukocytes,Ua: NEGATIVE
Nitrite: NEGATIVE
Protein, ur: NEGATIVE mg/dL
Specific Gravity, Urine: 1.012 (ref 1.005–1.030)
pH: 7 (ref 5.0–8.0)

## 2023-11-14 MED ORDER — CYCLOBENZAPRINE HCL 5 MG PO TABS
5.0000 mg | ORAL_TABLET | Freq: Three times a day (TID) | ORAL | 0 refills | Status: AC | PRN
  Filled 2023-11-14: qty 20, 7d supply, fill #0

## 2023-11-14 MED ORDER — ACETAMINOPHEN 500 MG PO TABS
1000.0000 mg | ORAL_TABLET | Freq: Once | ORAL | Status: AC
Start: 2023-11-14 — End: 2023-11-14
  Administered 2023-11-14: 1000 mg via ORAL
  Filled 2023-11-14: qty 2

## 2023-11-14 NOTE — Discharge Instructions (Signed)
 Hannah Esparza,   It was a pleasure to meet you in MAU, below are some of the things we discussed to help you with the pain.   Round Ligament Pain During Pregnancy Round ligament pain is a sharp pain or jabbing feeling often felt in the lower belly or groin area on one or both sides. It is one of the most common complaints during pregnancy and is considered a normal part of pregnancy. It is most often felt during the second trimester.  Here is what you need to know about round ligament pain, including some tips to help you feel better.  Causes of Round Ligament Pain  Several thick ligaments surround and support your womb (uterus) as it grows during pregnancy. One of them is called the round ligament.  The round ligament connects the front part of the womb to your groin, the area where your legs attach to your pelvis. The round ligament normally tightens and relaxes slowly.  As your baby and womb grow, the round ligament stretches. That makes it more likely to become strained.  Sudden movements can cause the ligament to tighten quickly, like a rubber band snapping. This causes a sudden and quick jabbing feeling.  Symptoms of Round Ligament Pain  Round ligament pain can be concerning and uncomfortable. But it is considered normal as your body changes during pregnancy.  The symptoms of round ligament pain include a sharp, sudden spasm in the belly. It usually affects the right side, but it may happen on both sides. The pain only lasts a few seconds.  Exercise may cause the pain, as will rapid movements such as:  sneezing coughing laughing rolling over in bed standing up too quickly  Comfort Measures:  Soak in a warm tub Tylenol  1000 mg by mouth every 6-8 hrs as needed for pain Slow position changes Laying on the affected side    Massage: Starting at the middle of your pubic bone, trace little circles in a wide U from your pubic bone to your hip bones on both sides.  Then starting  just above your pubic bone, press in and down, alternating sides to create a gentle rocking of your uterus back and forth.  Move your hands up the sides of your belly and back down. Do this 3-5 times upon waking and before bed.   Stretches: Get on hands and knees and alternate arching your back deeply while inhaling, and then rounding your back while exhaling. Modified runners lunge:  - Sit on a chair with half of your bottom on the chair and half off.  - Sit up tall, plant your front foot, and stretch your other foot out behind you.  - Breathe deeply for 5 breaths and then do the other side.   Also chiropractors and massages are safe in pregnancy. [facility] in [place] specializes in pregnancy care. You can visit their website at: [email or web address] to schedule a new patient chiropractic treatment (1 hour) appointment. Please let us  know if you have any other questions or concerns.  Maternity Support [employer] can purchase on [place] or through [place]. Below is an example.    Safe Medications in Pregnancy   Acne:  Benzoyl Peroxide  Salicylic Acid   Backache/Headache:  Tylenol : 2 regular strength every 4 hours OR               2 Extra strength every 6 hours   Colds/Coughs/Allergies:  Benadryl (alcohol free) 25 mg every 6 hours as needed  Breath right  strips  Claritin  Cepacol throat lozenges  Chloraseptic throat spray  Cold-Eeze- up to three times per day  Cough drops, alcohol free  Flonase (by prescription only)  Guaifenesin  Mucinex  Robitussin DM (plain only, alcohol free)  Saline nasal spray/drops  Sudafed (pseudoephedrine) & Actifed * use only after [redacted] weeks gestation and if you do not have high blood pressure  Tylenol   Vicks Vaporub  Zinc lozenges  Zyrtec   Constipation:  Colace  Ducolax suppositories  Fleet enema  Glycerin suppositories  Metamucil  Milk of magnesia  Miralax  Senokot  Smooth move tea   Diarrhea:   Kaopectate  Imodium A-D   *NO pepto Bismol   Hemorrhoids:  Anusol  Anusol HC  Preparation H  Tucks   Indigestion:  Tums  Maalox  Mylanta  Zantac  Pepcid   Insomnia:  Benadryl (alcohol free) 25mg  every 6 hours as needed  Tylenol  PM  Unisom, no Gelcaps   Leg Cramps:  Tums  MagGel   Nausea/Vomiting:  Bonine  Dramamine  Emetrol  Ginger extract  Sea bands  Meclizine  Nausea medication to take during pregnancy:  Unisom (doxylamine succinate 25 mg tablets) Take one tablet daily at bedtime. If symptoms are not adequately controlled, the dose can be increased to a maximum recommended dose of two tablets daily (1/2 tablet in the morning, 1/2 tablet mid-afternoon and one at bedtime).  Vitamin B6 100mg  tablets. Take one tablet twice a day (up to 200 mg per day).   Skin Rashes:  Aveeno products  Benadryl cream or 25mg  every 6 hours as needed  Calamine Lotion  1% cortisone cream   Yeast infection:  Gyne-lotrimin 7  Monistat 7    **If taking multiple medications, please check labels to avoid duplicating the same active ingredients  **take medication as directed on the label  ** Do not exceed 4000 mg of tylenol  in 24 hours  **Do not take medications that contain aspirin  or ibuprofen          Thank you for trusting us  to care for you, Abraham Hoffmann, Midwife

## 2023-11-14 NOTE — MAU Note (Signed)
.  Hannah Esparza is a 23 y.o. at [redacted]w[redacted]d here in MAU reporting: Patien reports she was at work and started feeling sharp pain in her left side. Patient reports she also had left leg pain that has resolved. Time was 1020 Onset of complaint: today 1020 Pain score: 4/10 intermittent There were no vitals filed for this visit.   FHT: Lab orders placed from triage:   ua

## 2023-11-14 NOTE — MAU Provider Note (Signed)
 None     S [name] is a 23 y.o. G1P0 pregnant female at [redacted]w[redacted]d who presents to MAU today with complaint of sharp pain on her left lower abdomen that started around around 1020. She reports that is comes and goes. She reports that the pain was a 10/10 and is now 3/10. She reports numbness/heaviness of her left leg at that time that has since resolved. Reports normal bowel and bladder function.   Receives care at [facility]. Prenatal records reviewed.  Pertinent items noted in HPI and remainder of comprehensive ROS otherwise negative.   O BP 119/66 (BP Location: Right Arm)   Pulse 72   Temp 98.3 F (36.8 C)   Resp 12   LMP 07/09/2023 (Approximate)   SpO2 100%  Physical Exam Vitals and nursing note reviewed.  Constitutional:      Appearance: Normal appearance.  HENT:     Head: Normocephalic.   Cardiovascular:     Rate and Rhythm: Normal rate and regular rhythm.     Pulses: Normal pulses.     Heart sounds: Normal heart sounds.  Pulmonary:     Effort: Pulmonary effort is normal.     Breath sounds: Normal breath sounds.  Abdominal:     General: Bowel sounds are normal. There is no distension.     Palpations: Abdomen is soft.     Tenderness: There is no abdominal tenderness. There is no right CVA tenderness or left CVA tenderness.     Comments: Gravid   Skin:    General: Skin is warm and dry.     Capillary Refill: Capillary refill takes less than 2 seconds.   Neurological:     General: No focal deficit present.     Mental Status: She is alert and oriented to person, place, and time.   Psychiatric:        Mood and Affect: Mood normal.        Behavior: Behavior normal.        Thought Content: Thought content normal.        Judgment: Judgment normal.      MDM: Reviewed EMR Symptoms relieved by abdominal massage here and Tylenol .   MAU Course:  A Pain of round ligament during pregnancy - Massage performed, Tylenol  effective.   Fetal heart rate present, second  trimester  Medical screening exam complete  P Discharge from MAU in stable condition with routine precautions Follow up at [facility] as scheduled for ongoing prenatal care Flexeril 5mg , 20 count sent for use PRN at bedtime. Demonstrated massage, stretches and counseled on comfort measures for RLP.   Allergies as of 11/14/2023   No Known Allergies      Medication List     TAKE these medications    aspirin  EC 81 MG tablet Take 1 tablet (81 mg total) by mouth daily. Swallow whole.   cyclobenzaprine 5 MG tablet Commonly known as: FLEXERIL Take 1 tablet (5 mg total) by mouth 3 (three) times daily as needed.   metroNIDAZOLE  500 MG tablet Commonly known as: FLAGYL  Take 1 tablet (500 mg total) by mouth 2 (two) times daily.   PRENATAL ADULT GUMMY/DHA/FA PO Take by mouth.        Raford Bunk, MSN, CNM 11/14/2023 1:33 PM  Certified Nurse Midwife, [facility]

## 2023-11-19 ENCOUNTER — Other Ambulatory Visit: Payer: Self-pay | Admitting: *Deleted

## 2023-11-19 ENCOUNTER — Ambulatory Visit: Attending: Certified Nurse Midwife

## 2023-11-19 ENCOUNTER — Ambulatory Visit (HOSPITAL_BASED_OUTPATIENT_CLINIC_OR_DEPARTMENT_OTHER): Admitting: Maternal & Fetal Medicine

## 2023-11-19 VITALS — BP 114/65 | HR 79

## 2023-11-19 DIAGNOSIS — I456 Pre-excitation syndrome: Secondary | ICD-10-CM

## 2023-11-19 DIAGNOSIS — Z3401 Encounter for supervision of normal first pregnancy, first trimester: Secondary | ICD-10-CM | POA: Diagnosis not present

## 2023-11-19 DIAGNOSIS — O321XX0 Maternal care for breech presentation, not applicable or unspecified: Secondary | ICD-10-CM | POA: Insufficient documentation

## 2023-11-19 DIAGNOSIS — Z362 Encounter for other antenatal screening follow-up: Secondary | ICD-10-CM

## 2023-11-19 DIAGNOSIS — Z3A19 19 weeks gestation of pregnancy: Secondary | ICD-10-CM

## 2023-11-19 DIAGNOSIS — Z3689 Encounter for other specified antenatal screening: Secondary | ICD-10-CM | POA: Insufficient documentation

## 2023-11-19 NOTE — Progress Notes (Signed)
 Patient information  Patient Name: Hannah Esparza  Patient MRN:   [id number]  Referring practice: [facility] Referring Provider: [facility]  Problem List   Patient Active Problem List   Diagnosis Date Noted   Sciatica of right side 10/03/2023   Supervision of normal first pregnancy 09/24/2023   Wolff-Parkinson-White syndrome 03/06/2021    Maternal Fetal Medicine Consult Hannah Esparza is a 23 y.o. G1P0 at [redacted]w[redacted]d here for ultrasound and consultation. She had Low risk of a female fetus. Carrier screening was Negative for the basic screening (SMA, alpha-thal, beta-thal, and cystic fibroisis. Maternal serum AFPwas negative. She has no acute concerns. Today we focused on the following:   Hannah Esparza: The patient has a history of Hannah Esparza syndrome.  She has never had a cardiac ablation and is not currently receiving medications.  Originally when she was diagnosed she was experiencing chest palpitations and was hospitalized for monitoring.  She has yet to meet with the [facility] cardiologist but has an appointment and a maternal echo scheduled.  I discussed the utility of a fetal echo is likely very low given that this is not known to be a congenital heart disease transmissible to the fetus and the fetal cardiac structures do appear normal today.  We will follow-up with a growth ultrasound at 28 weeks in the event that she requires medication for her Wolff-Parkinson-White that may be associated with growth restriction.   Sonographic findings Single intrauterine pregnancy at 19w 0d. Fetal cardiac activity:  Observed and appears normal. Presentation: Breech. The anatomic structures that were well seen appear normal without evidence of soft markers. The anatomic survey is complete.  Fetal biometry shows the estimated fetal weight at the 67 percentile. Amniotic fluid: Within normal limits.  MVP:  cm. Placenta: Posterior. Adnexa: No abnormality visualized. Cervical length: 3.8  cm.  There are limitations of prenatal ultrasound such as the inability to detect certain abnormalities due to poor visualization. Various factors such as fetal position, gestational age and maternal body habitus may increase the difficulty in visualizing the fetal anatomy.    Recommendations - EDD should be 04/14/2024 based on  LMP  (07/09/23). - Anatomy ultrasound was done today with the above findings (see report). - Follow-up with OB cardiologist - Follow-up growth ultrasound at 28 weeks.  If the fetal growth is normal at this time then no further ultrasounds are indicated unless certain medications are required for her cardiac condition.  We will reassess this at that time. - Delivery likely around [redacted] weeks gestation or sooner if indicated.  Review of Systems: A review of systems was performed and was negative except per HPI   Past Obstetrical History:  OB History  Gravida Para Term Preterm AB Living  1       SAB IAB Ectopic Multiple Live Births          # Outcome Date GA Lbr Len/2nd Weight Sex Type Anes PTL Lv  1 Current              Past Medical History:  Past Medical History:  Diagnosis Date   Viral warts 06/07/2008   Qualifier: Diagnosis of   By: Hannah Babinski MD, Hannah Esparza     IMO SNOMED Dx Update Oct 2024     Wolff-Parkinson-White syndrome      Past Surgical History:    Past Surgical History:  Procedure Laterality Date   NO PAST SURGERIES       Home Medications:   Current Outpatient  Medications on File Prior to Visit  Medication Sig Dispense Refill   aspirin  EC 81 MG tablet Take 1 tablet (81 mg total) by mouth daily. Swallow whole.     cyclobenzaprine (FLEXERIL) 5 MG tablet Take 1 tablet (5 mg total) by mouth 3 (three) times daily as needed. 20 tablet 0   Prenatal MV & Min w/FA-DHA (PRENATAL ADULT GUMMY/DHA/FA PO) Take by mouth.     metroNIDAZOLE  (FLAGYL ) 500 MG tablet Take 1 tablet (500 mg total) by mouth 2 (two) times daily. (Patient not taking: Reported on 11/19/2023)  14 tablet 0   No current facility-administered medications on file prior to visit.      Allergies:   No Known Allergies   Physical Exam:   Vitals:   11/19/23 0911  BP: 114/65  Pulse: 79   Sitting comfortably on the sonogram table Nonlabored breathing Normal rate and rhythm Abdomen is nontender  Thank you for the opportunity to be involved with this patient's care. Please let us  know if we can be of any further assistance.   45 minutes of time was spent reviewing the patient's chart including labs, imaging and documentation.  At least 50% of this time was spent with direct patient care discussing the diagnosis, management and prognosis of her care.  Valeta Gaudier MFM, [place]   11/19/2023  10:22 AM

## 2023-11-21 ENCOUNTER — Encounter: Payer: Self-pay | Admitting: Cardiology

## 2023-11-21 ENCOUNTER — Ambulatory Visit: Attending: Cardiology | Admitting: Cardiology

## 2023-11-21 VITALS — BP 118/68 | HR 86 | Ht 64.0 in | Wt 132.0 lb

## 2023-11-21 DIAGNOSIS — I456 Pre-excitation syndrome: Secondary | ICD-10-CM | POA: Insufficient documentation

## 2023-11-21 NOTE — Progress Notes (Signed)
  Electrophysiology Office Note:    Date:  11/21/2023   ID:  Hannah Esparza, DOB [birth year], MRN [id number]  [facility] Cardiologist:  Sheryle Donning, MD  [facility] Electrophysiologist:  Boyce Byes, MD   Referring MD: Jude Norton, NP   Chief Complaint: WPW  History of Present Illness:    Hannah Esparza is a 23 year old woman who I am seeing today for an evaluation of possible WPW.  She was seen by the women's clinic on November 19, 2023.  At that appointment she was [redacted] weeks pregnant.  At that appointment she reported a WPW history.  She was not on medications.  Had never had a catheter ablation.  She tells me that she was diagnosed with WPW about 3 years ago.  Her medical history is only significant for THC use.  During a prior workup in [place]  she had an exercise tolerance test.  She had persistent preexcitation but no inducible arrhythmias.  No syncope or presyncope.  No chest pain or shortness of breath.  She is tolerating her pregnancy well.  She is about 20 weeks.  Her due date is in November.  She tells me that she has never had a sustained arrhythmia.    Their past medical, social and family history was reviewed.   ROS:   Please see the history of present illness.    All other systems reviewed and are negative.  EKGs/Labs/Other Studies Reviewed:    The following studies were reviewed today:  August 28, 2023 EKG shows sinus rhythm with manifest preexcitation.  Delta wave is negative in V1, positive in lead I, positive in leads II and aVF but negative in lead III.  All available EKGs dating back to 2014 reviewed and show evidence of preexcitation.        Physical Exam:    VS:  BP 118/68 (BP Location: Left Arm, Patient Position: Sitting, Cuff Size: Normal)   Pulse 86   Ht 5' 4 (1.626 m)   Wt 132 lb (59.9 kg)   LMP 07/09/2023 (Approximate)   SpO2 99%   BMI 22.66 kg/m     Wt Readings from Last 3 Encounters:  11/21/23 132 lb (59.9 kg)  11/05/23 130  lb (59 kg)  11/04/23 128 lb (58.1 kg)     GEN: no distress CARD: RRR, No MRG RESP: No IWOB. CTAB.        ASSESSMENT AND PLAN:    1. Wolff-Parkinson-White (WPW) pattern      #WPW pattern ECG The patient has manifest preexcitation on her EKG.  She is asymptomatic without history of tachycardia Her ECG suggests an accessory pathway located on the lateral tricuspid valve annulus. Prior stress test showed persistent preexcitation with exertion. We discussed treatment options for WPW including treatment with antiarrhythmics or EP study and ablation.  Given she is pregnant and has no prior history of tachycardia, I do not think an aggressive strategy is warranted at this time.  She is also not at all interested in EP study and ablation.  I have recommended that we follow-up prior to her delivery date and shortly after delivery to make sure that her heart rhythm remained stable.  She should obviously call us  sooner if she experiences any tachycardia.    Signed, Hannah Prophet. [name], MD, [facility], [facility] 11/21/2023 4:48 PM    Electrophysiology [facility]

## 2023-11-21 NOTE — Addendum Note (Signed)
 Addended by: Arva Lathe on: 11/21/2023 05:07 PM   Modules accepted: Orders

## 2023-11-21 NOTE — Patient Instructions (Signed)
 Medication Instructions:  Your physician recommends that you continue on your current medications as directed. Please refer to the Current Medication list given to you today.  *If you need a refill on your cardiac medications before your next appointment, please call your pharmacy*  Lab Work: None ordered.  If you have labs (blood work) drawn today and your tests are completely normal, you will receive your results only by: MyChart Message (if you have MyChart) OR A paper copy in the mail If you have any lab test that is abnormal or we need to change your treatment, we will call you to review the results.  Testing/Procedures: Your physician has requested that you have an echocardiogram. Echocardiography is a painless test that uses sound waves to create images of your heart. It provides your doctor with information about the size and shape of your heart and how well your heart's chambers and valves are working. This procedure takes approximately one hour. There are no restrictions for this procedure. Please do NOT wear cologne, perfume, aftershave, or lotions (deodorant is allowed). Please arrive 15 minutes prior to your appointment time.  Please note: We ask at that you not bring children with you during ultrasound (echo/ vascular) testing. Due to room size and safety concerns, children are not allowed in the ultrasound rooms during exams. Our front office staff cannot provide observation of children in our lobby area while testing is being conducted. An adult accompanying a patient to their appointment will only be allowed in the ultrasound room at the discretion of the ultrasound technician under special circumstances. We apologize for any inconvenience.   Follow-Up: At [facility], you and your health needs are our priority.  As part of our continuing mission to provide you with exceptional heart care, our providers are all part of one team.  This team includes your primary  Cardiologist (physician) and Advanced Practice Providers or APPs (Physician Assistants and Nurse Practitioners) who all work together to provide you with the care you need, when you need it.  Your next appointment:   8 weeks with Dr [name]

## 2023-11-25 NOTE — Progress Notes (Deleted)
   Hannah Jackson D.CLEMENTEEN [facility] [address] [place] [address] Phone: [phone]   Assessment and Plan:     There are no diagnoses linked to this encounter.  *** - Patient has received relief with OMT in the past.  Elects for repeat OMT today.  Tolerated well per note below. - Decision today to treat with OMT was based on Physical Exam   After verbal consent patient was treated with HVLA (high velocity low amplitude), ME (muscle energy), FPR (flex positional release), ST (soft tissue), PC/PD (Pelvic Compression/ Pelvic Decompression) techniques in cervical, rib, thoracic, lumbar, and pelvic areas. Patient tolerated the procedure well with improvement in symptoms.  Patient educated on potential side effects of soreness and recommended to rest, hydrate, and use Tylenol  as needed for pain control.   Pertinent previous records reviewed include ***    Follow Up: ***     Subjective:   I, Emmalina Espericueta, am serving as a Neurosurgeon for Doctor Morene Mace  Chief Complaint: right side low back    HPI:    10/08/2023 Patient is a 23 year old female with right side low back pain. Patient states [redacted] weeks pregnant . Tailbone , low back and hip pain. Pain for about a week. 1/2 500 mg of tylenol  for the pain. No numbness or tingling.    11/05/2023 Patient states she is doing well. 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  Current Outpatient Medications  Medication Sig Dispense Refill   aspirin  EC 81 MG tablet Take 1 tablet (81 mg total) by mouth daily. Swallow whole.     cyclobenzaprine  (FLEXERIL ) 5 MG tablet Take 1 tablet (5 mg total) by mouth 3 (three) times daily as needed. 20 tablet 0   metroNIDAZOLE  (FLAGYL ) 500 MG tablet Take 1 tablet (500 mg total) by mouth 2 (two) times daily.  (Patient not taking: Reported on 11/21/2023) 14 tablet 0   Prenatal MV & Min w/FA-DHA (PRENATAL ADULT GUMMY/DHA/FA PO) Take by mouth.     No current facility-administered medications for this visit.      Objective:     There were no vitals filed for this visit.    There is no height or weight on file to calculate BMI.    Physical Exam:     General: Well-appearing, cooperative, sitting comfortably in no acute distress.   OMT Physical Exam:  ASIS Compression Test: Positive Right Cervical: TTP paraspinal, *** Rib: Bilateral elevated first rib with TTP Thoracic: TTP paraspinal,*** Lumbar: TTP paraspinal,*** Pelvis: Right anterior innominate  Electronically signed by:  Odis Mace D.CLEMENTEEN [facility] 8:41 AM 11/25/23

## 2023-11-26 ENCOUNTER — Ambulatory Visit (HOSPITAL_BASED_OUTPATIENT_CLINIC_OR_DEPARTMENT_OTHER): Admitting: Certified Nurse Midwife

## 2023-11-26 VITALS — BP 123/72 | HR 83 | Wt 134.2 lb

## 2023-11-26 DIAGNOSIS — Z3A20 20 weeks gestation of pregnancy: Secondary | ICD-10-CM | POA: Diagnosis not present

## 2023-11-26 DIAGNOSIS — M5431 Sciatica, right side: Secondary | ICD-10-CM

## 2023-11-26 DIAGNOSIS — Z3401 Encounter for supervision of normal first pregnancy, first trimester: Secondary | ICD-10-CM

## 2023-11-26 DIAGNOSIS — I456 Pre-excitation syndrome: Secondary | ICD-10-CM

## 2023-11-26 NOTE — Progress Notes (Signed)
    PRENATAL VISIT NOTE  Subjective:  Hannah Esparza is a 23 y.o. G1P0 at [redacted]w[redacted]d being seen today for ongoing prenatal care.  She is currently monitored for the following issues for this  pregnancy and has Wolff-Parkinson-White syndrome; Supervision of normal first pregnancy; and Sciatica of right side on their problem list.  Patient reports no complaints.  Contractions: Not present. Vag. Bleeding: None.  Movement: Present. Denies leaking of fluid.   The following portions of the patient's history were reviewed and updated as appropriate: allergies, current medications, past family history, past medical history, past social history, past surgical history and problem list.   Objective:    Vitals:   11/26/23 1004  BP: 123/72  Pulse: 83  Weight: 134 lb 3.2 oz (60.9 kg)    Fetal Status:      Movement: Present    General: Alert, oriented and cooperative. Patient is in no acute distress.  Skin: Skin is warm and dry. No rash noted.   Cardiovascular: Normal heart rate noted  Respiratory: Normal respiratory effort, no problems with respiration noted  Abdomen: Soft, gravid, appropriate for gestational age.  Pain/Pressure: Absent     Pelvic: Cervical exam deferred        Extremities: Normal range of motion.  Edema: None  Mental Status: Normal mood and affect. Normal behavior. Normal judgment and thought content.   Assessment and Plan:  Pregnancy: G1P0 at 107w0d  1. Encounter for supervision of normal first pregnancy in first trimester (Primary) - Panorama Low-Risk, female - AFP negative - on PNV and ASA 81mg  po once daily - US  (11/19/23): Breech, posterior placenta, AFI wnl, EFW 67%, anatomy scan complete. CL 3.8cm. Follow-up US  scheduled at 28 weeks.  - recheck 4 weeks   2. [redacted] weeks gestation of pregnancy   3. Sciatica of right side - Pt has appt with Sports Medicine, next appt 12/03/23   4. Wolff-Parkinson-White syndrome - No hx cardiac ablation, no meds currently - Appt with Dr Sheena  (12/12/23)   Preterm labor symptoms and general obstetric precautions including but not limited to vaginal bleeding, contractions, leaking of fluid and fetal movement were reviewed in detail with the patient. Please refer to After Visit Summary for other counseling recommendations.    Future Appointments  Date Time Provider Department Center  12/03/2023  3:00 PM Leonce Katz, DO [facility] None  12/12/2023  3:20 PM Tobb, Dub, DO [facility] None  12/17/2023 11:00 AM Lonni Slain, MD [facility]  12/23/2023  2:35 PM Delores Nidia CROME, FNP [facility]  01/06/2024  3:00 PM [facility]-ECHO 5 [facility] H&V  01/14/2024  2:00 PM [facility] PROVIDER 1 [facility]  01/14/2024  2:30 PM [facility] US3 [facility]  01/15/2024  8:35 AM Karinda Cabriales, Arland POUR, CNM [facility]OBGYN [facility]  01/23/2024  3:15 PM Cindie Ole DASEN, MD [facility] H&V  02/04/2024  9:35 AM Josey Forcier, Arland POUR, CNM [facility]  02/18/2024  9:15 AM Cleotilde Ronal RAMAN, MD [facility]  03/03/2024  9:15 AM Tad, Arland POUR, CNM [facility]  03/17/2024  9:15 AM Cleotilde Ronal RAMAN, MD [facility]  03/24/2024  9:15 AM Saben Donigan, Arland POUR, CNM [facility]  03/31/2024  9:15 AM Cleotilde Ronal RAMAN, MD [facility]  04/07/2024  9:35 AM Sophonie Goforth, Arland POUR, CNM [facility]  04/14/2024  9:35 AM Mally Gavina, Arland POUR, CNM [facility]    Arland POUR Tad, CNM

## 2023-12-03 ENCOUNTER — Ambulatory Visit: Admitting: Sports Medicine

## 2023-12-09 NOTE — Progress Notes (Deleted)
   Ben Jackson D.CLEMENTEEN [facility] [address] [place] [address] Phone: [phone]   Assessment and Plan:     There are no diagnoses linked to this encounter.  *** - Patient has received relief with OMT in the past.  Elects for repeat OMT today.  Tolerated well per note below. - Decision today to treat with OMT was based on Physical Exam   After verbal consent patient was treated with HVLA (high velocity low amplitude), ME (muscle energy), FPR (flex positional release), ST (soft tissue), PC/PD (Pelvic Compression/ Pelvic Decompression) techniques in cervical, rib, thoracic, lumbar, and pelvic areas. Patient tolerated the procedure well with improvement in symptoms.  Patient educated on potential side effects of soreness and recommended to rest, hydrate, and use Tylenol  as needed for pain control.   Pertinent previous records reviewed include ***    Follow Up: ***     Subjective:   I, Zyan Coby, am serving as a Neurosurgeon for Doctor Morene Mace  Chief Complaint: right side low back    HPI:    10/08/2023 Patient is a 23 year old female with right side low back pain. Patient states [redacted] weeks pregnant . Tailbone , low back and hip pain. Pain for about a week. 1/2 500 mg of tylenol  for the pain. No numbness or tingling.    11/05/2023 Patient states she is doing well. 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  Current Outpatient Medications  Medication Sig Dispense Refill   aspirin  EC 81 MG tablet Take 1 tablet (81 mg total) by mouth daily. Swallow whole.     cyclobenzaprine  (FLEXERIL ) 5 MG tablet Take 1 tablet (5 mg total) by mouth 3 (three) times daily as needed. 20 tablet 0   Prenatal MV & Min w/FA-DHA (PRENATAL ADULT GUMMY/DHA/FA PO) Take by mouth.     No current  facility-administered medications for this visit.      Objective:     There were no vitals filed for this visit.    There is no height or weight on file to calculate BMI.    Physical Exam:     General: Well-appearing, cooperative, sitting comfortably in no acute distress.   OMT Physical Exam:  ASIS Compression Test: Positive Right Cervical: TTP paraspinal, *** Rib: Bilateral elevated first rib with TTP Thoracic: TTP paraspinal,*** Lumbar: TTP paraspinal,*** Pelvis: Right anterior innominate  Electronically signed by:  Odis Mace D.CLEMENTEEN [facility] 7:34 AM 12/09/23

## 2023-12-10 ENCOUNTER — Ambulatory Visit: Admitting: Sports Medicine

## 2023-12-12 ENCOUNTER — Encounter: Payer: Self-pay | Admitting: Cardiology

## 2023-12-12 ENCOUNTER — Ambulatory Visit (INDEPENDENT_AMBULATORY_CARE_PROVIDER_SITE_OTHER): Admitting: Cardiology

## 2023-12-12 VITALS — BP 110/70 | HR 80 | Ht 65.0 in | Wt 138.0 lb

## 2023-12-12 DIAGNOSIS — I456 Pre-excitation syndrome: Secondary | ICD-10-CM | POA: Diagnosis not present

## 2023-12-12 MED ORDER — BLOOD PRESSURE MONITOR AUTOMAT DEVI: 1.0000 [IU] | Freq: Once | 0 refills | Status: AC

## 2023-12-12 NOTE — Patient Instructions (Signed)
 Medication Instructions:  Your physician recommends that you continue on your current medications as directed. Please refer to the Current Medication list given to you today.  *If you need a refill on your cardiac medications before your next appointment, please call your pharmacy*  Follow-Up: At [facility], you and your health needs are our priority.  As part of our continuing mission to provide you with exceptional heart care, our providers are all part of one team.  This team includes your primary Cardiologist (physician) and Advanced Practice Providers or APPs (Physician Assistants and Nurse Practitioners) who all work together to provide you with the care you need, when you need it.  Your next appointment:   12 week(s)  Provider:   Kardie Tobb, DO    Other Instructions Please take your blood pressure daily for 2 weeks and send in a MyChart message. Please include heart rates. (One message at the end of the 2 weeks).   HOW TO TAKE YOUR BLOOD PRESSURE: Rest 5 minutes before taking your blood pressure. Don't smoke or drink caffeinated beverages for at least 30 minutes before. Take your blood pressure before (not after) you eat. Sit comfortably with your back supported and both feet on the floor (don't cross your legs). Elevate your arm to heart level on a table or a desk. Use the proper sized cuff. It should fit smoothly and snugly around your bare upper arm. There should be enough room to slip a fingertip under the cuff. The bottom edge of the cuff should be 1 inch above the crease of the elbow. Ideally, take 3 measurements at one sitting and record the average.

## 2023-12-12 NOTE — Progress Notes (Unsigned)
 [facility]  {Choose New Eval or Follow Up Note:[phone]}   Prior CV Studies Reviewed: The following studies were reviewed today: ***  Past Medical History:  Diagnosis Date   Viral warts 06/07/2008   Qualifier: Diagnosis of   By: Johnny MD, Garnette LABOR     IMO SNOMED Dx Update Oct 2024     Wolff-Parkinson-White syndrome     Past Surgical History:  Procedure Laterality Date   NO PAST SURGERIES     { Click here to update PMH, PSH, OB Hx then refresh note  :1}   OB History     Gravida  1   Para      Term      Preterm      AB      Living         SAB      IAB      Ectopic      Multiple      Live Births              { Click here to update OB Charting then refresh note  :1}    Current Medications: Current Meds  Medication Sig   aspirin  EC 81 MG tablet Take 1 tablet (81 mg total) by mouth daily. Swallow whole.   cyclobenzaprine  (FLEXERIL ) 5 MG tablet Take 1 tablet (5 mg total) by mouth 3 (three) times daily as needed.   Prenatal MV & Min w/FA-DHA (PRENATAL ADULT GUMMY/DHA/FA PO) Take by mouth.     Allergies:   Patient has no known allergies.   Social History   Socioeconomic History   Marital status: Single    Spouse name: Not on file   Number of children: Not on file   Years of education: Not on file   Highest education level: Not on file  Occupational History   Not on file  Tobacco Use   Smoking status: Former    Current packs/day: 0.00    Types: Cigarettes    Quit date: 08/16/2023    Years since quitting: 0.3    Passive exposure: Yes   Smokeless tobacco: Never  Vaping Use   Vaping status: Never Used  Substance and Sexual Activity   Alcohol use: No   Drug use: No   Sexual activity: Yes  Other Topics Concern   Not on file  Social History Narrative   Not on file   Social Drivers of Health   Financial Resource Strain: Medium Risk (09/17/2023)   Overall Financial Resource Strain (CARDIA)    Difficulty of Paying Living  Expenses: Somewhat hard  Food Insecurity: Food Insecurity Present (09/17/2023)   Hunger Vital Sign    Worried About Running Out of Food in the Last Year: Often true    Ran Out of Food in the Last Year: Often true  Transportation Needs: Unmet Transportation Needs (09/17/2023)   PRAPARE - Administrator, Civil Service (Medical): Yes    Lack of Transportation (Non-Medical): Yes  Physical Activity: Insufficiently Active (09/17/2023)   Exercise Vital Sign    Days of Exercise per Week: 3 days    Minutes of Exercise per Session: 10 min  Stress: Stress Concern Present (09/17/2023)   [employer] of Occupational Health - Occupational Stress Questionnaire    Feeling of Stress : To some extent  Social Connections: Moderately Integrated (09/17/2023)   Social Connection and Isolation Panel    Frequency of Communication with Friends and Family: More than three times a week  Frequency of Social Gatherings with Friends and Family: Three times a week    Attends Religious Services: More than 4 times per year    Active Member of Clubs or Organizations: No    Attends Banker Meetings: Never    Marital Status: Living with partner  { Click here to update SDOH then refresh :1}    No family history on file. { Click here to update FH then refresh note    :1}   ROS:   Please see the history of present illness.    *** All other systems reviewed and are negative.   Labs/EKG Reviewed:    EKG:   EKG is *** ordered today.  The ekg ordered today demonstrates ***  Recent Labs: 08/28/2023: BUN 8; Creatinine, Ser 0.78; Magnesium 2.2; Potassium 4.2; Sodium 134; TSH 0.968 09/17/2023: Hemoglobin 11.6; Platelets 236   Recent Lipid Panel No results found for: CHOL, TRIG, HDL, CHOLHDL, LDLCALC, LDLDIRECT  Physical Exam:    VS:  BP 110/70 (BP Location: Right Arm, Patient Position: Sitting, Cuff Size: Normal)   Pulse 80   Ht 5' 5 (1.651 m)   Wt 138 lb (62.6 kg)   LMP  07/09/2023 (Approximate)   SpO2 100%   BMI 22.96 kg/m     Wt Readings from Last 3 Encounters:  12/12/23 138 lb (62.6 kg)  11/26/23 134 lb 3.2 oz (60.9 kg)  11/21/23 132 lb (59.9 kg)     GEN: *** Well nourished, well developed in no acute distress HEENT: Normal NECK: No JVD; No carotid bruits LYMPHATICS: No lymphadenopathy CARDIAC: ***RRR, no murmurs, rubs, gallops RESPIRATORY:  Clear to auscultation without rales, wheezing or rhonchi  ABDOMEN: Soft, non-tender, non-distended MUSCULOSKELETAL:  No edema; No deformity  SKIN: Warm and dry NEUROLOGIC:  Alert and oriented x 3 PSYCHIATRIC:  Normal affect    Risk Assessment/Risk Calculators:   { Click to calculate CARPREG II - THEN refresh note :1}    { Click to caclulate Mod WHO Class of CV Risk - THEN refresh note :1}     { Click for CHADS2VASc Score - THEN Refresh Note    :[id number]}      ASSESSMENT & PLAN:    *** There are no Patient Instructions on file for this visit.   Dispo:  No follow-ups on file.   Medication Adjustments/Labs and Tests Ordered: Current medicines are reviewed at length with the patient today.  Concerns regarding medicines are outlined above.  Tests Ordered: No orders of the defined types were placed in this encounter.  Medication Changes: No orders of the defined types were placed in this encounter.

## 2023-12-16 ENCOUNTER — Encounter (HOSPITAL_BASED_OUTPATIENT_CLINIC_OR_DEPARTMENT_OTHER): Payer: Self-pay

## 2023-12-17 ENCOUNTER — Ambulatory Visit (HOSPITAL_BASED_OUTPATIENT_CLINIC_OR_DEPARTMENT_OTHER): Admitting: Cardiology

## 2023-12-23 ENCOUNTER — Other Ambulatory Visit (HOSPITAL_BASED_OUTPATIENT_CLINIC_OR_DEPARTMENT_OTHER): Payer: Self-pay

## 2023-12-23 ENCOUNTER — Ambulatory Visit (HOSPITAL_BASED_OUTPATIENT_CLINIC_OR_DEPARTMENT_OTHER): Admitting: Obstetrics and Gynecology

## 2023-12-23 VITALS — BP 118/71 | HR 89 | Wt 137.8 lb

## 2023-12-23 DIAGNOSIS — Z3A23 23 weeks gestation of pregnancy: Secondary | ICD-10-CM | POA: Diagnosis not present

## 2023-12-23 DIAGNOSIS — Z3402 Encounter for supervision of normal first pregnancy, second trimester: Secondary | ICD-10-CM | POA: Diagnosis not present

## 2023-12-23 DIAGNOSIS — I456 Pre-excitation syndrome: Secondary | ICD-10-CM | POA: Diagnosis not present

## 2023-12-23 DIAGNOSIS — Z3401 Encounter for supervision of normal first pregnancy, first trimester: Secondary | ICD-10-CM

## 2023-12-23 MED ORDER — BLOOD PRESSURE MONITOR DEVI
0 refills | Status: AC
  Filled 2023-12-23: qty 1, fill #0

## 2023-12-23 NOTE — Progress Notes (Signed)
   PRENATAL VISIT NOTE  Subjective:  Hannah Esparza is a 23 y.o. G1P0 at [redacted]w[redacted]d being seen today for ongoing prenatal care.  She is currently monitored for the following issues for this low-risk pregnancy and has Wolff-Parkinson-White syndrome; Supervision of normal first pregnancy; and Sciatica of right side on their problem list.  Patient reports no complaints.  Contractions: Not present. Vag. Bleeding: None.  Movement: Present. Denies leaking of fluid.   The following portions of the patient's history were reviewed and updated as appropriate: allergies, current medications, past family history, past medical history, past social history, past surgical history and problem list.   Objective:    Vitals:   12/23/23 1501  BP: 118/71  Pulse: 89  Weight: 137 lb 12.8 oz (62.5 kg)    Fetal Status:  Fetal Heart Rate (bpm): 145   Movement: Present    General: Alert, oriented and cooperative. Patient is in no acute distress.  Skin: Skin is warm and dry. No rash noted.   Cardiovascular: Normal heart rate noted  Respiratory: Normal respiratory effort, no problems with respiration noted  Abdomen: Soft, gravid, appropriate for gestational age.  Pain/Pressure: Absent     Pelvic: Cervical exam deferred        Extremities: Normal range of motion.  Edema: None  Mental Status: Normal mood and affect. Normal behavior. Normal judgment and thought content.   Assessment and Plan:  Pregnancy: G1P0 at [redacted]w[redacted]d 1. Encounter for supervision of normal first pregnancy in first trimester (Primary) Follow up u/s 8/13 Peds list provided in wrap up  Discussed options for birth control, she does not desire IUD, she had injection previously that caused mood change, she had implant previously that caused very irregular bleeding. Discussed POPs   2. [redacted] weeks gestation of pregnancy Anticipatory guidance regarding GTT and labs next visit, discussed NPO status after midnight  consider  3. Wolff-Parkinson-White  syndrome Needs small BP cuff, new one sent Seen by Tobb 7/11, echo scheduled 8/5   - Blood Pressure Monitor DEVI; Needs small cuff  Dispense: 1 each; Refill: 0    Preterm labor symptoms and general obstetric precautions including but not limited to vaginal bleeding, contractions, leaking of fluid and fetal movement were reviewed in detail with the patient. Please refer to After Visit Summary for other counseling recommendations.   Return GTT next visit 8/13.  Future Appointments  Date Time Provider Department Center  01/06/2024  3:00 PM [facility] 5 [facility]  01/14/2024  2:00 PM [facility] PROVIDER 1 [facility]  01/14/2024  2:30 PM [facility] US3 [facility]  01/15/2024  8:00 AM [facility] OBGYN LAB [facility]  01/15/2024  8:35 AM Lo, Arland POUR, CNM [facility]  01/23/2024  3:15 PM Cindie Ole DASEN, MD [facility]  02/04/2024  9:35 AM Lo, Arland POUR, CNM [facility]  02/18/2024  9:15 AM Cleotilde Ronal RAMAN, MD [facility]  03/03/2024  9:15 AM Lo, Arland POUR, CNM [facility]  03/04/2024  3:40 PM Tobb, Dub, DO [facility]  03/11/2024  4:00 PM Lonni Slain, MD [facility]  03/17/2024  9:15 AM Cleotilde Ronal RAMAN, MD [facility]  03/24/2024  9:15 AM Tad, Arland POUR, CNM [facility]  03/31/2024  9:15 AM Cleotilde Ronal RAMAN, MD [facility]  04/07/2024  9:35 AM Lo, Arland POUR, CNM [facility]  04/14/2024  9:35 AM Lo, Arland POUR, CNM [facility]    Nidia Daring, FNP

## 2023-12-23 NOTE — Patient Instructions (Signed)

## 2023-12-25 ENCOUNTER — Encounter (HOSPITAL_BASED_OUTPATIENT_CLINIC_OR_DEPARTMENT_OTHER): Admitting: Obstetrics & Gynecology

## 2024-01-06 ENCOUNTER — Ambulatory Visit (HOSPITAL_COMMUNITY)
Admission: RE | Admit: 2024-01-06 | Discharge: 2024-01-06 | Disposition: A | Discharge status: Home / Self Care | Source: Ambulatory Visit | Attending: Cardiology | Admitting: Cardiology

## 2024-01-06 DIAGNOSIS — R9431 Abnormal electrocardiogram [ECG] [EKG]: Secondary | ICD-10-CM | POA: Diagnosis not present

## 2024-01-06 DIAGNOSIS — I456 Pre-excitation syndrome: Secondary | ICD-10-CM | POA: Insufficient documentation

## 2024-01-06 LAB — ECHOCARDIOGRAM COMPLETE
Area-P 1/2: 3.64 cm2
S' Lateral: 2.8 cm

## 2024-01-14 ENCOUNTER — Telehealth (HOSPITAL_BASED_OUTPATIENT_CLINIC_OR_DEPARTMENT_OTHER): Payer: Self-pay

## 2024-01-14 ENCOUNTER — Ambulatory Visit

## 2024-01-14 ENCOUNTER — Ambulatory Visit: Attending: Maternal & Fetal Medicine | Admitting: Maternal & Fetal Medicine

## 2024-01-14 DIAGNOSIS — Z3402 Encounter for supervision of normal first pregnancy, second trimester: Secondary | ICD-10-CM | POA: Diagnosis not present

## 2024-01-14 DIAGNOSIS — I456 Pre-excitation syndrome: Secondary | ICD-10-CM | POA: Diagnosis not present

## 2024-01-14 DIAGNOSIS — O99412 Diseases of the circulatory system complicating pregnancy, second trimester: Secondary | ICD-10-CM | POA: Diagnosis not present

## 2024-01-14 DIAGNOSIS — Z3A27 27 weeks gestation of pregnancy: Secondary | ICD-10-CM

## 2024-01-14 DIAGNOSIS — Z362 Encounter for other antenatal screening follow-up: Secondary | ICD-10-CM

## 2024-01-14 NOTE — Progress Notes (Signed)
   Patient information  Patient Name: Hannah Esparza  Patient MRN:   [id number]  Referring practice: MFM Referring Provider: [facility]  Problem List   Patient Active Problem List   Diagnosis Date Noted   Sciatica of right side 10/03/2023   Supervision of normal first pregnancy 09/24/2023   Wolff-Parkinson-White syndrome 03/06/2021    Maternal Fetal medicine Consult  Syndi Esparza is a 23 y.o. G1P0 at [redacted]w[redacted]d here for ultrasound and consultation. Hannah Esparza is doing well today with no acute concerns. Today we focused on the following:   WPW: The patient has a history of Wolff-Parkinson-White syndrome.  She recently saw her cardiologist and an echo was ordered which appeared normal.  The patient has occasional shortness of breath that is within the limits of a normal pregnancy.  She occasionally has palpitations but these are rare.  I discussed the importance of monitoring her cardiac symptoms and report these to her cardiologist with any concerns.  Since she is not taking a beta-blocker she does not need future growth ultrasounds.  The patient had time to ask questions that were answered to her satisfaction.  She verbalized understanding and agrees to proceed with the plan below.  Sonographic findings Single intrauterine pregnancy at 27w 0d.  Fetal cardiac activity:  Observed and appears normal. Presentation: Cephalic. Interval fetal anatomy appears normal. Fetal biometry shows the estimated fetal weight at the 51 percentile. Amniotic fluid volume: Within normal limits. MVP: 3.81 cm. Placenta: Posterior.  There are limitations of prenatal ultrasound such as the inability to detect certain abnormalities due to poor visualization. Various factors such as fetal position, gestational age and maternal body habitus may increase the difficulty in visualizing the fetal anatomy.    Recommendations -No further ultrasounds are recommended at this time based on the current  indications. If future indications arise (e.g. size/date discrepancy on fundal height, gestational diabetes or hypertension) and an ultrasound is to be desired at our MFM office, please send a referral.   Review of Systems: A review of systems was performed and was negative except per HPI   Vitals and Physical Exam    01/14/2024    2:08 PM 12/23/2023    3:01 PM 12/12/2023    3:44 PM  Vitals with BMI  Weight  137 lbs 13 oz   BMI  22.93   Systolic 113 118 889  Diastolic 65 71 70  Pulse 93 89     Sitting comfortably on the sonogram table Nonlabored breathing Normal rate and rhythm Abdomen is nontender  Past pregnancies OB History  Gravida Para Term Preterm AB Living  1       SAB IAB Ectopic Multiple Live Births          # Outcome Date GA Lbr Len/2nd Weight Sex Type Anes PTL Lv  1 Current              I spent 20 minutes reviewing the patients chart, including labs and images as well as counseling the patient about her medical conditions. Greater than 50% of the time was spent in direct face-to-face patient counseling.  [name]  MFM, [facility]   01/14/2024  3:06 PM

## 2024-01-14 NOTE — Telephone Encounter (Signed)
 Patient left a message on the nurse line requesting a return call for fasting instructions for GTT test tomorrow morning. Spoke with patient advised nothing to eat or drink after 12 midnight tonight. Patient is scheduled for GTT tomorrow 8/14 at 8 am. Patient verbalizes understanding.

## 2024-01-15 ENCOUNTER — Ambulatory Visit (HOSPITAL_BASED_OUTPATIENT_CLINIC_OR_DEPARTMENT_OTHER): Admitting: Certified Nurse Midwife

## 2024-01-15 ENCOUNTER — Encounter: Payer: Self-pay | Admitting: Sports Medicine

## 2024-01-15 ENCOUNTER — Other Ambulatory Visit (HOSPITAL_BASED_OUTPATIENT_CLINIC_OR_DEPARTMENT_OTHER)

## 2024-01-15 ENCOUNTER — Other Ambulatory Visit (HOSPITAL_BASED_OUTPATIENT_CLINIC_OR_DEPARTMENT_OTHER): Payer: Self-pay

## 2024-01-15 VITALS — BP 136/68 | HR 85 | Wt 145.4 lb

## 2024-01-15 DIAGNOSIS — I456 Pre-excitation syndrome: Secondary | ICD-10-CM | POA: Diagnosis not present

## 2024-01-15 DIAGNOSIS — O99012 Anemia complicating pregnancy, second trimester: Secondary | ICD-10-CM

## 2024-01-15 DIAGNOSIS — Z3A27 27 weeks gestation of pregnancy: Secondary | ICD-10-CM

## 2024-01-15 DIAGNOSIS — Z1332 Encounter for screening for maternal depression: Secondary | ICD-10-CM | POA: Diagnosis not present

## 2024-01-15 DIAGNOSIS — O99412 Diseases of the circulatory system complicating pregnancy, second trimester: Secondary | ICD-10-CM

## 2024-01-15 DIAGNOSIS — D509 Iron deficiency anemia, unspecified: Secondary | ICD-10-CM

## 2024-01-15 DIAGNOSIS — Z3402 Encounter for supervision of normal first pregnancy, second trimester: Secondary | ICD-10-CM

## 2024-01-15 MED ORDER — ONDANSETRON HCL 8 MG PO TABS
8.0000 mg | ORAL_TABLET | Freq: Three times a day (TID) | ORAL | 0 refills | Status: DC | PRN
  Filled 2024-01-15: qty 5, 2d supply, fill #0

## 2024-01-16 LAB — GLUCOSE TOLERANCE, 2 HOURS W/ 1HR
Glucose, 1 hour: 85 mg/dL (ref 70–179)
Glucose, 2 hour: 82 mg/dL (ref 70–152)
Glucose, Fasting: 69 mg/dL — ABNORMAL LOW (ref 70–91)

## 2024-01-16 LAB — CBC
Hematocrit: 27.2 % — ABNORMAL LOW (ref 34.0–46.6)
Hemoglobin: 8.7 g/dL — ABNORMAL LOW (ref 11.1–15.9)
MCH: 30.7 pg (ref 26.6–33.0)
MCHC: 32 g/dL (ref 31.5–35.7)
MCV: 96 fL (ref 79–97)
Platelets: 191 x10E3/uL (ref 150–450)
RBC: 2.83 x10E6/uL — ABNORMAL LOW (ref 3.77–5.28)
RDW: 12.8 % (ref 11.7–15.4)
WBC: 10.4 x10E3/uL (ref 3.4–10.8)

## 2024-01-16 LAB — HIV ANTIBODY (ROUTINE TESTING W REFLEX): HIV Screen 4th Generation wRfx: NONREACTIVE

## 2024-01-16 LAB — RPR: RPR Ser Ql: NONREACTIVE

## 2024-01-16 NOTE — Progress Notes (Signed)
 PRENATAL VISIT NOTE  Subjective:  Hannah Esparza is a 23 y.o. G1P0 at [redacted]w[redacted]d being seen today for ongoing prenatal care.  She is currently monitored for the following issues for this  pregnancy and has Wolff-Parkinson-White syndrome; Supervision of normal first pregnancy; and Sciatica of right side on their problem list.  Patient reports no complaints.  Contractions: Irregular. Vag. Bleeding: None.  Movement: Present. Denies leaking of fluid.   The following portions of the patient's history were reviewed and updated as appropriate: allergies, current medications, past family history, past medical history, past social history, past surgical history and problem list.   Objective:    Vitals:   01/15/24 0859  BP: 136/68  Pulse: 85  Weight: 145 lb 6.4 oz (66 kg)    Fetal Status:  Fetal Heart Rate (bpm): 135 Fundal Height: 27 cm Movement: Present    General: Alert, oriented and cooperative. Patient is in no acute distress.  Skin: Skin is warm and dry. No rash noted.   Cardiovascular: Normal heart rate noted  Respiratory: Normal respiratory effort, no problems with respiration noted  Abdomen: Soft, gravid, appropriate for gestational age.  Pain/Pressure: Absent     Pelvic: Cervical exam deferred        Extremities: Normal range of motion.  Edema: None  Mental Status: Normal mood and affect. Normal behavior. Normal judgment and thought content.   Assessment and Plan:  Pregnancy: G1P0 at [redacted]w[redacted]d  1. Encounter for supervision of normal first pregnancy in first trimester (Primary) - Panorama Low-Risk, female - AFP negative - on PNV and ASA 81mg  po once daily - US  (11/19/23): Breech, posterior placenta, AFI wnl, EFW 67%, anatomy scan complete. CL 3.8cm. Follow-up US  scheduled at 28 weeks.  - US  (01/14/24): Vtx, posterior placenta, AFV wnl, EFW 2lb 5oz (51%), interval anatomy appears normal. No further US  recommended at this time.  - Discussed availability Tdap and RSV Vaccines in pregnancy    2. [redacted] weeks gestation of pregnancy   3. Sciatica of right side - Pt has appt with Sports Medicine   4. Wolff-Parkinson-White syndrome (asymptomatic) - No hx cardiac ablation, no meds currently - Appt with Dr Sheena (12/12/23): Hx of WPW syndrome/Pattern - she is asymptomatic. Reviewed her monitor consistent with WPW patterns. I advised the patient what this means to have WPW syndrome and the symptoms warning signs. She has not gotten her echo previously order. I will reorder her echo. She will need an treadmill exercise, this can be done in the postpartum period.  She will need to see EP as well.  She is unable to get her blood pressure due small adult size still big we will check pharmacy for pediatric-sized blood pressure cuff. - Advise low or no caffeine intake. - Encourage adequate hydration. - Monitor for syncope or intolerable palpitations and report if she occurs.  - We will follow her closely in pregnancy  Preterm labor symptoms and general obstetric precautions including but not limited to vaginal bleeding, contractions, leaking of fluid and fetal movement were reviewed in detail with the patient. Please refer to After Visit Summary for other counseling recommendations.   No follow-ups on file.  Future Appointments  Date Time Provider Department Center  01/23/2024  3:15 PM Cindie Ole DASEN, MD [facility]V  02/04/2024  9:35 AM Georgana Romain, Arland POUR, CNM [facility]  02/18/2024  9:15 AM Cleotilde Ronal RAMAN, MD [facility]  03/03/2024  9:15 AM Tad, Arland POUR, CNM [facility]  03/04/2024  3:40 PM Tobb,  Kardie, DO [facility]  03/11/2024  4:00 PM Lonni Slain, MD [facility]  03/17/2024  9:15 AM Cleotilde Ronal RAMAN, MD [facility]  03/24/2024  9:15 AM Tad, Arland POUR, CNM [facility]  03/31/2024  9:15 AM Cleotilde Ronal RAMAN, MD [facility]  04/07/2024  9:35 AM Tad, Arland POUR, CNM [facility]  04/14/2024  9:35 AM Tico Crotteau, Arland POUR, CNM [facility]    Arland POUR Tad, CNM

## 2024-01-20 ENCOUNTER — Ambulatory Visit (HOSPITAL_BASED_OUTPATIENT_CLINIC_OR_DEPARTMENT_OTHER): Payer: Self-pay | Admitting: Certified Nurse Midwife

## 2024-01-20 NOTE — Progress Notes (Signed)
 You can order venofer infusions with the infusion navigator in EPIC.  I can remind you how to do this if you need.  MSM

## 2024-01-21 ENCOUNTER — Other Ambulatory Visit (HOSPITAL_BASED_OUTPATIENT_CLINIC_OR_DEPARTMENT_OTHER): Payer: Self-pay | Admitting: Certified Nurse Midwife

## 2024-01-21 NOTE — Progress Notes (Unsigned)
  Electrophysiology Office Follow up Visit Note:    Date:  01/23/2024   ID:  [name], DOB April 23, 2001, MRN [id number]  PCP:  Pcp, No  [facility] Cardiologist:  Shelda Bruckner, MD  [facility] Electrophysiologist:  OLE ONEIDA HOLTS, MD    Interval History:     Hannah Esparza is a 23 y.o. female who presents for a follow up visit.   I last saw the patient in November 21, 2023 for WPW.  At that appointment she was asymptomatic without evidence of tachycardia.  She was pregnant at the time of her last appointment.  We planned to follow-up before delivery and then shortly after delivery to make sure her heart rhythms remained stable.  She was not interested in any invasive procedures.  She is doing well.  She is 6 months along in her pregnancy.  Feels well.  No sustained arrhythmias.       Past medical, surgical, social and family history were reviewed.  ROS:   Please see the history of present illness.    All other systems reviewed and are negative.  EKGs/Labs/Other Studies Reviewed:    The following studies were reviewed today:  January 06, 2024 echo EF 65 to 70% RV normal Trivial MR         Physical Exam:    VS:  BP 116/60   Pulse 93   Ht 5' 5 (1.651 m)   Wt 150 lb 12.8 oz (68.4 kg)   LMP 07/09/2023 (Approximate)   SpO2 99%   BMI 25.09 kg/m     Wt Readings from Last 3 Encounters:  01/23/24 150 lb 12.8 oz (68.4 kg)  01/15/24 145 lb 6.4 oz (66 kg)  12/23/23 137 lb 12.8 oz (62.5 kg)     GEN: no distress CARD: RRR, No MRG RESP: No IWOB. CTAB.      ASSESSMENT:    1. Wolff-Parkinson-White (WPW) pattern    PLAN:    In order of problems listed above:  #WPW pattern EKG No evidence of arrhythmias or symptoms.  Suspect accessory pathway located on the lateral tricuspid valve annulus.  For now, recommend conservative management strategy.  I did discuss the role of EP study and ablation and she is not at all interested in invasive evaluation or  treatment.  Recommend following up after the delivery of her child. Plan for 62-month follow-up with APP.   Signed, OLE HOLTS, MD, [facility] 01/23/2024 3:38 PM    Electrophysiology [facility]

## 2024-01-23 ENCOUNTER — Encounter: Payer: Self-pay | Admitting: Cardiology

## 2024-01-23 ENCOUNTER — Ambulatory Visit: Attending: Cardiology | Admitting: Cardiology

## 2024-01-23 VITALS — BP 116/60 | HR 93 | Ht 65.0 in | Wt 150.8 lb

## 2024-01-23 DIAGNOSIS — I456 Pre-excitation syndrome: Secondary | ICD-10-CM | POA: Diagnosis not present

## 2024-01-23 NOTE — Patient Instructions (Signed)
 Medication Instructions:  Your physician recommends that you continue on your current medications as directed. Please refer to the Current Medication list given to you today.  *If you need a refill on your cardiac medications before your next appointment, please call your pharmacy*  Lab Work: None ordered.  If you have labs (blood work) drawn today and your tests are completely normal, you will receive your results only by: MyChart Message (if you have MyChart) OR A paper copy in the mail If you have any lab test that is abnormal or we need to change your treatment, we will call you to review the results.  Testing/Procedures: None ordered.   Follow-Up: At [facility], you and your health needs are our priority.  As part of our continuing mission to provide you with exceptional heart care, our providers are all part of one team.  This team includes your primary Cardiologist (physician) and Advanced Practice Providers or APPs (Physician Assistants and Nurse Practitioners) who all work together to provide you with the care you need, when you need it.  Your next appointment:   6 months with Dr Hiram APP

## 2024-01-26 ENCOUNTER — Encounter: Payer: Self-pay | Admitting: Certified Nurse Midwife

## 2024-01-26 ENCOUNTER — Telehealth: Payer: Self-pay | Admitting: Pharmacy Technician

## 2024-01-26 DIAGNOSIS — D509 Iron deficiency anemia, unspecified: Secondary | ICD-10-CM | POA: Insufficient documentation

## 2024-01-26 NOTE — Addendum Note (Signed)
 Addended byBETHA TAD RACE on: 01/26/2024 09:01 AM   Modules accepted: Orders

## 2024-01-26 NOTE — Telephone Encounter (Signed)
 Arland, Patient will be scheduled as soon as possible.  Auth Submission: NO AUTH NEEDED Site of care: Site of care: [facility] Payer: [employer] blue - medicaid Medication & CPT/J Code(s) submitted: Venofer  (Iron  Sucrose) J1756 Diagnosis Code:  Route of submission (phone, fax, portal):  Phone # Fax # Auth type: Buy/Bill PB Units/visits requested: x5 doses Reference number:  Approval from: 01/26/24 to 05/27/24

## 2024-01-27 ENCOUNTER — Ambulatory Visit (INDEPENDENT_AMBULATORY_CARE_PROVIDER_SITE_OTHER)

## 2024-01-27 VITALS — BP 115/71 | HR 83 | Temp 98.4°F | Resp 16 | Ht 65.0 in | Wt 151.6 lb

## 2024-01-27 DIAGNOSIS — O99019 Anemia complicating pregnancy, unspecified trimester: Secondary | ICD-10-CM

## 2024-01-27 DIAGNOSIS — Z3A28 28 weeks gestation of pregnancy: Secondary | ICD-10-CM

## 2024-01-27 DIAGNOSIS — D509 Iron deficiency anemia, unspecified: Secondary | ICD-10-CM | POA: Diagnosis not present

## 2024-01-27 MED ORDER — IRON SUCROSE 20 MG/ML IV SOLN
200.0000 mg | Freq: Once | INTRAVENOUS | Status: AC
  Administered 2024-01-27: 200 mg via INTRAVENOUS
  Filled 2024-01-27: qty 10

## 2024-01-27 NOTE — Progress Notes (Signed)
 Diagnosis: Iron  Deficiency Anemia  Provider:  Praveen Mannam MD  Procedure: IV Push  IV Type: Peripheral, IV Location: L Antecubital  Venofer  (Iron  Sucrose), Dose: 200 mg  Post Infusion IV Care: Observation period completed and Peripheral IV Discontinued  Discharge: Condition: Good, Destination: Home . AVS Declined  Performed by:  Maximiano JONELLE Pouch, LPN

## 2024-01-29 ENCOUNTER — Encounter: Payer: Self-pay | Admitting: Sports Medicine

## 2024-01-29 ENCOUNTER — Ambulatory Visit

## 2024-01-29 VITALS — BP 111/71 | HR 93 | Temp 98.4°F | Resp 18 | Ht 65.0 in | Wt 149.0 lb

## 2024-01-29 DIAGNOSIS — D509 Iron deficiency anemia, unspecified: Secondary | ICD-10-CM | POA: Diagnosis not present

## 2024-01-29 DIAGNOSIS — O99013 Anemia complicating pregnancy, third trimester: Secondary | ICD-10-CM | POA: Diagnosis not present

## 2024-01-29 DIAGNOSIS — Z3A29 29 weeks gestation of pregnancy: Secondary | ICD-10-CM

## 2024-01-29 MED ORDER — IRON SUCROSE 20 MG/ML IV SOLN
200.0000 mg | Freq: Once | INTRAVENOUS | Status: AC
  Administered 2024-01-29: 200 mg via INTRAVENOUS
  Filled 2024-01-29: qty 10

## 2024-01-29 NOTE — Progress Notes (Signed)
 Diagnosis: Iron  Deficiency Anemia  Provider:  Praveen Mannam MD  Procedure: IV Push  IV Type: Peripheral, IV Location: R Antecubital  Venofer (Iron  Sucrose), Dose: 200 mg  Post Infusion IV Care: Observation period completed  Discharge: Condition: Good, Destination: Home . AVS Declined  Performed by:  Rachelle Bue, RN

## 2024-02-04 ENCOUNTER — Ambulatory Visit (HOSPITAL_BASED_OUTPATIENT_CLINIC_OR_DEPARTMENT_OTHER): Admitting: Certified Nurse Midwife

## 2024-02-04 VITALS — BP 113/68 | HR 77 | Wt 151.6 lb

## 2024-02-04 DIAGNOSIS — I456 Pre-excitation syndrome: Secondary | ICD-10-CM | POA: Diagnosis not present

## 2024-02-04 DIAGNOSIS — O99019 Anemia complicating pregnancy, unspecified trimester: Secondary | ICD-10-CM

## 2024-02-04 DIAGNOSIS — Z3A30 30 weeks gestation of pregnancy: Secondary | ICD-10-CM | POA: Diagnosis not present

## 2024-02-04 DIAGNOSIS — Z3403 Encounter for supervision of normal first pregnancy, third trimester: Secondary | ICD-10-CM

## 2024-02-04 DIAGNOSIS — O99413 Diseases of the circulatory system complicating pregnancy, third trimester: Secondary | ICD-10-CM

## 2024-02-04 DIAGNOSIS — O99013 Anemia complicating pregnancy, third trimester: Secondary | ICD-10-CM | POA: Diagnosis not present

## 2024-02-04 DIAGNOSIS — D509 Iron deficiency anemia, unspecified: Secondary | ICD-10-CM | POA: Diagnosis not present

## 2024-02-04 NOTE — Progress Notes (Signed)
   PRENATAL VISIT NOTE  Subjective:  Hannah Esparza is a 23 y.o. G1P0 at [redacted]w[redacted]d being seen today for ongoing prenatal care.  She is currently monitored for the following issues for this low-risk pregnancy and has Wolff-Parkinson-White syndrome; Supervision of normal first pregnancy; Sciatica of right side; and Iron  deficiency anemia of pregnancy on their problem list.  Patient reports no complaints.  Contractions: Not present. Vag. Bleeding: None.  Movement: Present. Denies leaking of fluid.   The following portions of the patient's history were reviewed and updated as appropriate: allergies, current medications, past family history, past medical history, past social history, past surgical history and problem list.   Objective:    Vitals:   02/04/24 1119  BP: 113/68  Pulse: 77  Weight: 151 lb 9.6 oz (68.8 kg)    Fetal Status:  Fetal Heart Rate (bpm): 155 Fundal Height: 30 cm Movement: Present    General: Alert, oriented and cooperative. Patient is in no acute distress.  Skin: Skin is warm and dry. No rash noted.   Cardiovascular: Normal heart rate noted  Respiratory: Normal respiratory effort, no problems with respiration noted  Abdomen: Soft, gravid, appropriate for gestational age.  Pain/Pressure: Absent     Pelvic: Cervical exam deferred        Extremities: Normal range of motion.  Edema: None  Mental Status: Normal mood and affect. Normal behavior. Normal judgment and thought content.   Assessment and Plan:  Pregnancy: G1P0 at [redacted]w[redacted]d  1. Encounter for supervision of normal first pregnancy in third trimester (Primary) - Pt declines Influenza Vaccine, Tdap Vaccine, RSV Vaccine but will let us  know if she decides to receive any of these.  2. [redacted] weeks gestation of pregnancy   3. Wolff-Parkinson-White syndrome - Appt 01/23/24 completed with Dr. Ole Holts - No evidence of arrhythmias or symptoms - Follow-up after delivery  4. Iron  deficiency anemia of pregnancy - Pt  receiving Venofer  iron  infusions (3 more scheduled)  Preterm labor symptoms and general obstetric precautions including but not limited to vaginal bleeding, contractions, leaking of fluid and fetal movement were reviewed in detail with the patient. Please refer to After Visit Summary for other counseling recommendations.   No follow-ups on file.  Future Appointments  Date Time Provider Department Center  02/05/2024  2:30 PM [facility] 4 [facility] None  02/10/2024  3:00 PM [facility] 4 [facility] None  02/12/2024  2:30 PM [facility] 6 [facility] None  02/18/2024  9:15 AM Cleotilde Ronal RAMAN, MD [facility] [address]  03/03/2024  9:15 AM Tad, Arland POUR, CNM [facility] [address]  03/04/2024  3:40 PM Tobb, Dub, DO [facility]  03/11/2024  4:00 PM Lonni Slain, MD [facility] [address]  03/17/2024  9:15 AM Cleotilde Ronal RAMAN, MD [facility] [address]  03/24/2024  9:15 AM Tad, Arland POUR, CNM [facility] [address]  03/31/2024  9:15 AM Cleotilde Ronal RAMAN, MD [facility] [address]  04/07/2024  9:35 AM Kellen Hover, Arland POUR, CNM [facility] [address]  04/14/2024  9:35 AM Kahron Kauth, Arland POUR, CNM [facility] [phone] [address]    Arland POUR Tad, CNM

## 2024-02-05 ENCOUNTER — Ambulatory Visit (INDEPENDENT_AMBULATORY_CARE_PROVIDER_SITE_OTHER)

## 2024-02-05 VITALS — BP 104/66 | HR 94 | Temp 97.9°F | Resp 16 | Ht 65.0 in | Wt 153.8 lb

## 2024-02-05 DIAGNOSIS — Z3A30 30 weeks gestation of pregnancy: Secondary | ICD-10-CM

## 2024-02-05 DIAGNOSIS — O99013 Anemia complicating pregnancy, third trimester: Secondary | ICD-10-CM | POA: Diagnosis not present

## 2024-02-05 DIAGNOSIS — D509 Iron deficiency anemia, unspecified: Secondary | ICD-10-CM | POA: Diagnosis not present

## 2024-02-05 MED ORDER — IRON SUCROSE 20 MG/ML IV SOLN
200.0000 mg | Freq: Once | INTRAVENOUS | Status: AC
Start: 2024-02-05 — End: 2024-02-05
  Administered 2024-02-05: 200 mg via INTRAVENOUS
  Filled 2024-02-05: qty 10

## 2024-02-05 NOTE — Progress Notes (Signed)
 Diagnosis: Iron  Deficiency Anemia  Provider:  Praveen Mannam MD  Procedure: IV Push  IV Type: Peripheral, IV Location: L Antecubital  Venofer  (Iron  Sucrose), Dose: 200 mg  Post Infusion IV Care: Observation period completed  Discharge: Condition: Good, Destination: Home . AVS Declined  Performed by:  Rachelle Bue, RN

## 2024-02-10 ENCOUNTER — Ambulatory Visit

## 2024-02-10 MED ORDER — IRON SUCROSE 20 MG/ML IV SOLN: 200.0000 mg | Freq: Once | INTRAVENOUS | Status: DC

## 2024-02-10 MED ORDER — SODIUM CHLORIDE 0.9 % IV BOLUS (SEPSIS)
250.0000 mL | Freq: Once | INTRAVENOUS | Status: DC
  Filled 2024-02-10: qty 250

## 2024-02-11 ENCOUNTER — Encounter (HOSPITAL_BASED_OUTPATIENT_CLINIC_OR_DEPARTMENT_OTHER): Payer: Self-pay | Admitting: Certified Nurse Midwife

## 2024-02-11 ENCOUNTER — Telehealth (HOSPITAL_BASED_OUTPATIENT_CLINIC_OR_DEPARTMENT_OTHER): Payer: Self-pay

## 2024-02-11 NOTE — Telephone Encounter (Signed)
 Spoke with patient. Patients reports that her last episode of dizziness was at 9:30 am. States prior to the episode of dizziness she had eaten a bagel for breakfast. Patient went home to rest and has had no further symptoms. Advised patient that to ensure her BP and blood glucose levels remain stabilized it is encouraged to pair carbohydrates with protein. Patient has a BP monitor and cuff at home that she does not feel is working properly. Unable to check BP at this time. Advised patient to bring BP monitor and cuff with her to her next appointment. Patient is agreeable. Advised to drink plenty of fluids and if she has a dizzy spell to lay down and elevate feet. Advised it can take 10-15 minutes to pass. If this continues to occur she will need to contact the office to be seen for evaluation. Patient is agreeable.

## 2024-02-12 ENCOUNTER — Ambulatory Visit (INDEPENDENT_AMBULATORY_CARE_PROVIDER_SITE_OTHER)

## 2024-02-12 VITALS — BP 108/71 | HR 95 | Temp 98.3°F | Resp 20 | Ht 65.0 in | Wt 160.2 lb

## 2024-02-12 DIAGNOSIS — O99013 Anemia complicating pregnancy, third trimester: Secondary | ICD-10-CM | POA: Diagnosis not present

## 2024-02-12 DIAGNOSIS — D509 Iron deficiency anemia, unspecified: Secondary | ICD-10-CM | POA: Diagnosis not present

## 2024-02-12 DIAGNOSIS — Z3A31 31 weeks gestation of pregnancy: Secondary | ICD-10-CM

## 2024-02-12 MED ORDER — SODIUM CHLORIDE 0.9 % IV BOLUS (SEPSIS)
250.0000 mL | Freq: Once | INTRAVENOUS | Status: AC
  Administered 2024-02-12: 250 mL via INTRAVENOUS
  Filled 2024-02-12: qty 250

## 2024-02-12 MED ORDER — IRON SUCROSE 20 MG/ML IV SOLN
200.0000 mg | Freq: Once | INTRAVENOUS | Status: AC
  Administered 2024-02-12: 200 mg via INTRAVENOUS
  Filled 2024-02-12: qty 10

## 2024-02-12 NOTE — Progress Notes (Signed)
 Diagnosis: Iron Deficiency Anemia  Provider:  Chilton Greathouse MD  Procedure: IV Push  IV Type: Peripheral, IV Location: R Antecubital  Venofer (Iron Sucrose), Dose: 200 mg  Post Infusion IV Care: Observation period completed and Peripheral IV Discontinued  Discharge: Condition: Good, Destination: Home . AVS Declined  Performed by:  Loney Hering, LPN

## 2024-02-17 ENCOUNTER — Ambulatory Visit

## 2024-02-17 VITALS — BP 121/77 | HR 93 | Temp 98.3°F | Resp 20 | Ht 65.0 in | Wt 160.2 lb

## 2024-02-17 DIAGNOSIS — O99013 Anemia complicating pregnancy, third trimester: Secondary | ICD-10-CM | POA: Diagnosis not present

## 2024-02-17 DIAGNOSIS — Z3A31 31 weeks gestation of pregnancy: Secondary | ICD-10-CM

## 2024-02-17 DIAGNOSIS — D509 Iron deficiency anemia, unspecified: Secondary | ICD-10-CM

## 2024-02-17 MED ORDER — SODIUM CHLORIDE 0.9 % IV BOLUS (SEPSIS)
250.0000 mL | Freq: Once | INTRAVENOUS | Status: DC
  Filled 2024-02-17: qty 250

## 2024-02-17 MED ORDER — IRON SUCROSE 20 MG/ML IV SOLN
200.0000 mg | Freq: Once | INTRAVENOUS | Status: AC
  Administered 2024-02-17: 200 mg via INTRAVENOUS
  Filled 2024-02-17: qty 10

## 2024-02-17 NOTE — Progress Notes (Signed)
 Diagnosis: Iron Deficiency Anemia  Provider:  Chilton Greathouse MD  Procedure: IV Push  IV Type: Peripheral, IV Location: L Antecubital  Venofer (Iron Sucrose), Dose: 200 mg  Post Infusion IV Care: Patient declined observation and Peripheral IV Discontinued  Discharge: Condition: Good, Destination: Home . AVS Declined  Performed by:  Marlow Baars [place], RN

## 2024-02-18 ENCOUNTER — Other Ambulatory Visit (HOSPITAL_BASED_OUTPATIENT_CLINIC_OR_DEPARTMENT_OTHER): Payer: Self-pay

## 2024-02-18 ENCOUNTER — Encounter (HOSPITAL_BASED_OUTPATIENT_CLINIC_OR_DEPARTMENT_OTHER): Payer: Self-pay | Admitting: Obstetrics & Gynecology

## 2024-02-18 ENCOUNTER — Ambulatory Visit (HOSPITAL_BASED_OUTPATIENT_CLINIC_OR_DEPARTMENT_OTHER): Admitting: Obstetrics & Gynecology

## 2024-02-18 ENCOUNTER — Other Ambulatory Visit: Payer: Self-pay

## 2024-02-18 ENCOUNTER — Telehealth (HOSPITAL_BASED_OUTPATIENT_CLINIC_OR_DEPARTMENT_OTHER): Payer: Self-pay | Admitting: *Deleted

## 2024-02-18 ENCOUNTER — Other Ambulatory Visit (HOSPITAL_COMMUNITY): Payer: Self-pay

## 2024-02-18 VITALS — BP 116/66 | HR 92 | Wt 156.4 lb

## 2024-02-18 DIAGNOSIS — O99413 Diseases of the circulatory system complicating pregnancy, third trimester: Secondary | ICD-10-CM | POA: Diagnosis not present

## 2024-02-18 DIAGNOSIS — D509 Iron deficiency anemia, unspecified: Secondary | ICD-10-CM

## 2024-02-18 DIAGNOSIS — O99013 Anemia complicating pregnancy, third trimester: Secondary | ICD-10-CM | POA: Diagnosis not present

## 2024-02-18 DIAGNOSIS — I456 Pre-excitation syndrome: Secondary | ICD-10-CM | POA: Diagnosis not present

## 2024-02-18 DIAGNOSIS — Z3A32 32 weeks gestation of pregnancy: Secondary | ICD-10-CM

## 2024-02-18 DIAGNOSIS — Z3403 Encounter for supervision of normal first pregnancy, third trimester: Secondary | ICD-10-CM

## 2024-02-18 MED ORDER — ABRYSVO 120 MCG/0.5ML IM SOLR
1.0000 mL | Freq: Once | INTRAMUSCULAR | 0 refills | Status: AC
  Filled 2024-02-18 – 2024-03-03 (×4): qty 1, 1d supply, fill #0

## 2024-02-18 NOTE — Telephone Encounter (Signed)
-----   Message from [facility] sent at 02/18/2024  9:58 AM EDT ----- Regarding: RE: follow up appt with you Sure thing! Porter, can you cancel this appt with me on 10/9? Thanks! ----- Message ----- From: Cleotilde Ronal RAMAN, MD Sent: 02/18/2024   9:47 AM EDT To: Shelda Bruckner, MD Subject: follow up appt with you                        Bridgette, This pt has an appt with you on 10/9 and Dr. Sheena on 10/2.  She has WFW and is pregnant.  I'm going to have her keep the appt with Dr. Sheena.  Can you have your office cancel your appt for 10/9.  I'm sure you have someone else who could use that appt.  Thanks.  Elvie Cleotilde

## 2024-02-18 NOTE — Telephone Encounter (Signed)
 Appt with Dr. Lonni on 10/9 cancelled, as indicated in this message.

## 2024-02-18 NOTE — Progress Notes (Signed)
   PRENATAL VISIT NOTE  Subjective:  Hannah Esparza is a 23 y.o. G1P0 at [redacted]w[redacted]d being seen today for ongoing prenatal care.  She is currently monitored for the following issues for this low-risk pregnancy and has Wolff-Parkinson-White syndrome; Supervision of normal first pregnancy; Sciatica of right side; and Iron  deficiency anemia of pregnancy on their problem list.  Patient reports no complaints.  Contractions: Not present.  .  Movement: Present. Denies leaking of fluid.   The following portions of the patient's history were reviewed and updated as appropriate: allergies, current medications, past family history, past medical history, past social history, past surgical history and problem list.   Objective:   Vitals:   02/18/24 0939  BP: 116/66  Pulse: 92  Weight: 156 lb 6.4 oz (70.9 kg)    Fetal Status: Fetal Heart Rate (bpm): 137 Fundal Height: 33 cm  Movement: Present    General:  Alert, oriented and cooperative. Patient is in no acute distress.  Skin: Skin is warm and dry. No rash noted.   Cardiovascular: Normal heart rate noted  Respiratory: Normal respiratory effort, no problems with respiration noted  Abdomen: Soft, gravid, appropriate for gestational age.  Pain/Pressure: Absent     Pelvic: Cervical exam deferred        Extremities: Normal range of motion.  Edema: None  Mental Status: Normal mood and affect. Normal behavior. Normal judgment and thought content.   Assessment and Plan:  Pregnancy: G1P0 at [redacted]w[redacted]d 1. [redacted] weeks gestation of pregnancy (Primary) - taking PNV and   2. Encounter for supervision of normal first pregnancy in third trimester - recheck 2 weeks  3. Iron  deficiency anemia of pregnancy - has completed iron  infusions - consider repeat hb about a month  4. Wolff-Parkinson-White syndrome - had echo 8/5 - has follow up 10/2 with Dr. Sheena  Preterm labor symptoms and general obstetric precautions including but not limited to vaginal bleeding,  contractions, leaking of fluid and fetal movement were reviewed in detail with the patient. Please refer to After Visit Summary for other counseling recommendations.   Return in about 2 weeks (around 03/03/2024).  Future Appointments  Date Time Provider Department Center  03/03/2024  9:15 AM Tad Arland POUR, CNM [facility] [address]  03/04/2024  3:40 PM Tobb, Dub, DO [facility]  03/11/2024  4:00 PM Lonni Slain, MD [facility] [address]  03/17/2024  9:15 AM Cleotilde Ronal RAMAN, MD [facility] [address]  03/24/2024  9:15 AM Tad, Arland POUR, CNM [facility] [address]  03/31/2024  9:15 AM Cleotilde Ronal RAMAN, MD [facility] [phone] [address]  04/08/2024  9:35 AM Delores Nidia CROME, FNP [facility] [phone] [facility]  04/15/2024 10:55 AM Delores Nidia CROME, FNP [facility] [phone] [address]    Ronal RAMAN Cleotilde, MD

## 2024-02-19 ENCOUNTER — Other Ambulatory Visit (HOSPITAL_COMMUNITY): Payer: Self-pay

## 2024-03-03 ENCOUNTER — Ambulatory Visit (HOSPITAL_BASED_OUTPATIENT_CLINIC_OR_DEPARTMENT_OTHER): Admitting: Certified Nurse Midwife

## 2024-03-03 ENCOUNTER — Other Ambulatory Visit (HOSPITAL_BASED_OUTPATIENT_CLINIC_OR_DEPARTMENT_OTHER): Payer: Self-pay

## 2024-03-03 ENCOUNTER — Other Ambulatory Visit (HOSPITAL_COMMUNITY)
Admission: RE | Admit: 2024-03-03 | Discharge: 2024-03-03 | Disposition: A | Discharge status: Home / Self Care | Source: Ambulatory Visit | Attending: Certified Nurse Midwife | Admitting: Certified Nurse Midwife

## 2024-03-03 VITALS — BP 117/71 | HR 99 | Wt 159.2 lb

## 2024-03-03 DIAGNOSIS — N898 Other specified noninflammatory disorders of vagina: Secondary | ICD-10-CM

## 2024-03-03 DIAGNOSIS — Z3403 Encounter for supervision of normal first pregnancy, third trimester: Secondary | ICD-10-CM | POA: Insufficient documentation

## 2024-03-03 DIAGNOSIS — O99413 Diseases of the circulatory system complicating pregnancy, third trimester: Secondary | ICD-10-CM | POA: Diagnosis not present

## 2024-03-03 DIAGNOSIS — D509 Iron deficiency anemia, unspecified: Secondary | ICD-10-CM | POA: Diagnosis not present

## 2024-03-03 DIAGNOSIS — I456 Pre-excitation syndrome: Secondary | ICD-10-CM | POA: Diagnosis not present

## 2024-03-03 DIAGNOSIS — Z3A34 34 weeks gestation of pregnancy: Secondary | ICD-10-CM | POA: Diagnosis not present

## 2024-03-03 DIAGNOSIS — O99891 Other specified diseases and conditions complicating pregnancy: Secondary | ICD-10-CM

## 2024-03-03 DIAGNOSIS — O99013 Anemia complicating pregnancy, third trimester: Secondary | ICD-10-CM | POA: Diagnosis not present

## 2024-03-03 NOTE — Progress Notes (Signed)
   PRENATAL VISIT NOTE  Subjective:  Hannah Esparza is a 23 y.o. G1P0 at [redacted]w[redacted]d being seen today for ongoing prenatal care.  She is currently monitored for the following issues for this  pregnancy and has Wolff-Parkinson-White syndrome; Supervision of normal first pregnancy; Sciatica of right side; and Iron  deficiency anemia of pregnancy on their problem list.  Patient reports no bleeding, no contractions, no cramping, no leaking, and pt having some discharge with odor potatoes at times. Reports active fetal movement. Desires RSV Vaccine. Declines Flu Vaccine..  Contractions: Not present. Vag. Bleeding: None.  Movement: Present. Denies leaking of fluid.   The following portions of the patient's history were reviewed and updated as appropriate: allergies, current medications, past family history, past medical history, past social history, past surgical history and problem list.   Objective:    Vitals:   03/03/24 0929  BP: 117/71  Pulse: 99  Weight: 159 lb 3.2 oz (72.2 kg)    Fetal Status:      Movement: Present    General: Alert, oriented and cooperative. Patient is in no acute distress.  Skin: Skin is warm and dry. No rash noted.   Cardiovascular: Normal heart rate noted  Respiratory: Normal respiratory effort, no problems with respiration noted  Abdomen: Soft, gravid, appropriate for gestational age.  Pain/Pressure: Absent     Pelvic: Cervical exam deferred        Extremities: Normal range of motion.  Edema: None  Mental Status: Normal mood and affect. Normal behavior. Normal judgment and thought content.   Assessment and Plan:  Pregnancy: G1P0 at [redacted]w[redacted]d  1. Encounter for supervision of normal first pregnancy in third trimester (Primary) - Doing well - RTO in 2 weeks (36 weeks) for Return Ob with GBS/GC/CT  2. Wolff-Parkinson-White syndrome   3. Iron  deficiency anemia of pregnancy - CBC - Iron , TIBC and Ferritin Panel - IV Iron  infusions scheduled  4. [redacted] weeks gestation  of pregnancy  5. Vaginal discharge - Cervicovaginal ancillary only( [place])  Preterm labor symptoms and general obstetric precautions including but not limited to vaginal bleeding, contractions, leaking of fluid and fetal movement were reviewed in detail with the patient. Please refer to After Visit Summary for other counseling recommendations.   No follow-ups on file.  Future Appointments  Date Time Provider Department Center  03/04/2024  3:40 PM Tobb, Kardie, DO [facility]  03/17/2024  9:15 AM Cleotilde Ronal RAMAN, MD [facility] [address]  03/24/2024  9:15 AM Tad, Arland POUR, CNM [facility] [address]  03/31/2024  9:15 AM Cleotilde Ronal RAMAN, MD [facility] [phone] [facility]  04/08/2024  9:35 AM Delores Nidia CROME, FNP [facility] [phone] [facility]  04/15/2024 10:55 AM Delores Nidia CROME, FNP [facility] [phone] [address]    Arland POUR Tad, CNM

## 2024-03-04 ENCOUNTER — Encounter: Payer: Self-pay | Admitting: Cardiology

## 2024-03-04 ENCOUNTER — Ambulatory Visit: Attending: Cardiology | Admitting: Cardiology

## 2024-03-04 VITALS — BP 130/78 | HR 80 | Ht 65.0 in | Wt 163.0 lb

## 2024-03-04 DIAGNOSIS — Z3A34 34 weeks gestation of pregnancy: Secondary | ICD-10-CM | POA: Insufficient documentation

## 2024-03-04 DIAGNOSIS — I456 Pre-excitation syndrome: Secondary | ICD-10-CM | POA: Diagnosis present

## 2024-03-04 LAB — IRON,TIBC AND FERRITIN PANEL
Ferritin: 197 ng/mL — ABNORMAL HIGH (ref 15–150)
Iron Saturation: 26 % (ref 15–55)
Iron: 112 ug/dL (ref 27–159)
Total Iron Binding Capacity: 424 ug/dL (ref 250–450)
UIBC: 312 ug/dL (ref 131–425)

## 2024-03-04 LAB — CBC
Hematocrit: 34.5 % (ref 34.0–46.6)
Hemoglobin: 11.6 g/dL (ref 11.1–15.9)
MCH: 31.7 pg (ref 26.6–33.0)
MCHC: 33.6 g/dL (ref 31.5–35.7)
MCV: 94 fL (ref 79–97)
Platelets: 209 x10E3/uL (ref 150–450)
RBC: 3.66 x10E6/uL — ABNORMAL LOW (ref 3.77–5.28)
RDW: 15.9 % — ABNORMAL HIGH (ref 11.7–15.4)
WBC: 9.9 x10E3/uL (ref 3.4–10.8)

## 2024-03-04 NOTE — Patient Instructions (Signed)
 Medication Instructions:  Your physician recommends that you continue on your current medications as directed. Please refer to the Current Medication list given to you today.  *If you need a refill on your cardiac medications before your next appointment, please call your pharmacy*   Follow-Up: At [facility], you and your health needs are our priority.  As part of our continuing mission to provide you with exceptional heart care, our providers are all part of one team.  This team includes your primary Cardiologist (physician) and Advanced Practice Providers or APPs (Physician Assistants and Nurse Practitioners) who all work together to provide you with the care you need, when you need it.  Your next appointment:    Dec 23 at 10am   Provider:   Kardie Tobb, DO

## 2024-03-05 LAB — CERVICOVAGINAL ANCILLARY ONLY
Bacterial Vaginitis (gardnerella): NEGATIVE
Candida Glabrata: NEGATIVE
Candida Vaginitis: NEGATIVE
Chlamydia: NEGATIVE
Comment: NEGATIVE
Comment: NEGATIVE
Comment: NEGATIVE
Comment: NEGATIVE
Comment: NEGATIVE
Comment: NORMAL
Neisseria Gonorrhea: NEGATIVE
Trichomonas: NEGATIVE

## 2024-03-05 NOTE — Progress Notes (Signed)
 Cardio-Obstetrics Clinic  Follow Up Note   Date:  03/06/2024   ID:  Hannah Esparza, DOB 07-24-2000, MRN [id number]  PCP:  Freddrick Johns   [facility] Providers Cardiologist:  Shelda Bruckner, MD  Electrophysiologist:  OLE ONEIDA HOLTS, MD        Referring MD: No ref. provider found   Chief Complaint:  I am doing well  History of Present Illness:    Hannah Esparza is a 23 y.o. female [G1P0] who returns for follow up of cardiovascular care in pregnancy.  Medical history includers iron  deficiency anemia and WPW syndrome who presents for follow-up on her iron  levels and WPW management.  She is [redacted] weeks pregnant and experiences lightheadedness without syncope since July, possibly related to iron  deficiency. She has completed five iron  infusions and takes prenatal vitamins with iron . Recent blood tests show improved iron  levels, though red blood cell count remains low, with improved RDW.  She has Wolff-Parkinson-White syndrome and has not started any medications. A monitor was placed in April, and she continues to be monitored for this condition.   Prior CV Studies Reviewed: The following studies were reviewed today: Echo and zio  Past Medical History:  Diagnosis Date   Viral warts 06/07/2008   Qualifier: Diagnosis of   By: Johnny MD, Garnette LABOR     IMO SNOMED Dx Update Oct 2024     Wolff-Parkinson-White syndrome     Past Surgical History:  Procedure Laterality Date   NO PAST SURGERIES        OB History     Gravida  1   Para      Term      Preterm      AB      Living         SAB      IAB      Ectopic      Multiple      Live Births                  Current Medications: Current Meds  Medication Sig   aspirin  EC 81 MG tablet Take 1 tablet (81 mg total) by mouth daily. Swallow whole.   Blood Pressure Monitor DEVI Needs small cuff   Blood Pressure Monitoring (BLOOD PRESSURE MONITOR AUTOMAT) DEVI 1 Units by Does not apply route once for 1  dose.   ondansetron  (ZOFRAN ) 8 MG tablet Take 1 tablet (8 mg total) by mouth every 8 (eight) hours as needed for nausea or vomiting.   Prenatal MV & Min w/FA-DHA (PRENATAL ADULT GUMMY/DHA/FA PO) Take by mouth.     Allergies:   Patient has no known allergies.   Social History   Socioeconomic History   Marital status: Single    Spouse name: Not on file   Number of children: Not on file   Years of education: Not on file   Highest education level: Not on file  Occupational History   Not on file  Tobacco Use   Smoking status: Former    Current packs/day: 0.00    Types: Cigarettes    Quit date: 08/16/2023    Years since quitting: 0.5    Passive exposure: Yes   Smokeless tobacco: Never  Vaping Use   Vaping status: Never Used  Substance and Sexual Activity   Alcohol use: No   Drug use: No   Sexual activity: Yes  Other Topics Concern   Not on file  Social History Narrative   Not on  file   Social Drivers of Health   Financial Resource Strain: Medium Risk (09/17/2023)   Overall Financial Resource Strain (CARDIA)    Difficulty of Paying Living Expenses: Somewhat hard  Food Insecurity: Food Insecurity Present (09/17/2023)   Hunger Vital Sign    Worried About Running Out of Food in the Last Year: Often true    Ran Out of Food in the Last Year: Often true  Transportation Needs: Unmet Transportation Needs (09/17/2023)   PRAPARE - Administrator, Civil Service (Medical): Yes    Lack of Transportation (Non-Medical): Yes  Physical Activity: Insufficiently Active (09/17/2023)   Exercise Vital Sign    Days of Exercise per Week: 3 days    Minutes of Exercise per Session: 10 min  Stress: Stress Concern Present (09/17/2023)   [employer] of Occupational Health - Occupational Stress Questionnaire    Feeling of Stress : To some extent  Social Connections: Moderately Integrated (09/17/2023)   Social Connection and Isolation Panel    Frequency of Communication with Friends and  Family: More than three times a week    Frequency of Social Gatherings with Friends and Family: Three times a week    Attends Religious Services: More than 4 times per year    Active Member of Clubs or Organizations: No    Attends Banker Meetings: Never    Marital Status: Living with partner      History reviewed. No pertinent family history.    ROS:   Please see the history of present illness.     All other systems reviewed and are negative.   Labs/EKG Reviewed:    EKG:  none today   Recent Labs: 08/28/2023: BUN 8; Creatinine, Ser 0.78; Magnesium 2.2; Potassium 4.2; Sodium 134; TSH 0.968 03/03/2024: Hemoglobin 11.6; Platelets 209   Recent Lipid Panel No results found for: CHOL, TRIG, HDL, CHOLHDL, LDLCALC, LDLDIRECT  Physical Exam:    VS:  BP 130/78 (BP Location: Left Arm, Patient Position: Sitting, Cuff Size: Normal)   Pulse 80   Ht 5' 5 (1.651 m)   Wt 163 lb (73.9 kg)   LMP 07/09/2023 (Approximate)   SpO2 99%   BMI 27.12 kg/m     Wt Readings from Last 3 Encounters:  03/04/24 163 lb (73.9 kg)  03/03/24 159 lb 3.2 oz (72.2 kg)  02/18/24 156 lb 6.4 oz (70.9 kg)     GEN:  Well nourished, well developed in no acute distress HEENT: Normal NECK: No JVD; No carotid bruits LYMPHATICS: No lymphadenopathy CARDIAC: RRR, no murmurs, rubs, gallops RESPIRATORY:  Clear to auscultation without rales, wheezing or rhonchi  ABDOMEN: Soft, non-tender, non-distended MUSCULOSKELETAL:  No edema; No deformity  SKIN: Warm and dry NEUROLOGIC:  Alert and oriented x 3 PSYCHIATRIC:  Normal affect    Risk Assessment/Risk Calculators:                 ASSESSMENT & PLAN:     Wolff-Parkinson-White syndrome WPW syndrome stable, no medication, she has declined consideration for ablation needed.   Iron  deficiency anemia complicating third trimester pregnancy Anemia improved post five iron  infusions. Hemoglobin levels expected to rise, alleviating  symptoms.  Supervision of normal pregnancy, third trimester [redacted] weeks gestation, prefers natural delivery. Regular prenatal care ongoing with OB team at [facility]. - Plan for spontaneous delivery unless otherwise indicated.    Patient Instructions  Medication Instructions:  Your physician recommends that you continue on your current medications as directed. Please refer to the Current  Medication list given to you today.  *If you need a refill on your cardiac medications before your next appointment, please call your pharmacy*   Follow-Up: At [facility], you and your health needs are our priority.  As part of our continuing mission to provide you with exceptional heart care, our providers are all part of one team.  This team includes your primary Cardiologist (physician) and Advanced Practice Providers or APPs (Physician Assistants and Nurse Practitioners) who all work together to provide you with the care you need, when you need it.  Your next appointment:    Dec 23 at 10am   Provider:   Bradyn Vassey, DO            Dispo:  No follow-ups on file.   Medication Adjustments/Labs and Tests Ordered: Current medicines are reviewed at length with the patient today.  Concerns regarding medicines are outlined above.  Tests Ordered: No orders of the defined types were placed in this encounter.  Medication Changes: No orders of the defined types were placed in this encounter.

## 2024-03-09 ENCOUNTER — Ambulatory Visit (HOSPITAL_BASED_OUTPATIENT_CLINIC_OR_DEPARTMENT_OTHER): Payer: Self-pay | Admitting: Certified Nurse Midwife

## 2024-03-10 ENCOUNTER — Encounter (HOSPITAL_BASED_OUTPATIENT_CLINIC_OR_DEPARTMENT_OTHER): Admitting: Certified Nurse Midwife

## 2024-03-11 ENCOUNTER — Ambulatory Visit (HOSPITAL_BASED_OUTPATIENT_CLINIC_OR_DEPARTMENT_OTHER): Admitting: Cardiology

## 2024-03-17 ENCOUNTER — Other Ambulatory Visit (HOSPITAL_COMMUNITY): Payer: Self-pay

## 2024-03-17 ENCOUNTER — Ambulatory Visit (HOSPITAL_BASED_OUTPATIENT_CLINIC_OR_DEPARTMENT_OTHER): Admitting: Obstetrics & Gynecology

## 2024-03-17 ENCOUNTER — Other Ambulatory Visit (HOSPITAL_COMMUNITY)
Admission: RE | Admit: 2024-03-17 | Discharge: 2024-03-17 | Disposition: A | Discharge status: Home / Self Care | Source: Ambulatory Visit | Attending: Obstetrics & Gynecology | Admitting: Obstetrics & Gynecology

## 2024-03-17 VITALS — BP 129/73 | HR 93 | Wt 164.0 lb

## 2024-03-17 DIAGNOSIS — Z3A36 36 weeks gestation of pregnancy: Secondary | ICD-10-CM

## 2024-03-17 DIAGNOSIS — D649 Anemia, unspecified: Secondary | ICD-10-CM

## 2024-03-17 DIAGNOSIS — I456 Pre-excitation syndrome: Secondary | ICD-10-CM | POA: Diagnosis not present

## 2024-03-17 DIAGNOSIS — Z1332 Encounter for screening for maternal depression: Secondary | ICD-10-CM | POA: Diagnosis not present

## 2024-03-17 DIAGNOSIS — Z3403 Encounter for supervision of normal first pregnancy, third trimester: Secondary | ICD-10-CM | POA: Insufficient documentation

## 2024-03-17 DIAGNOSIS — O99013 Anemia complicating pregnancy, third trimester: Secondary | ICD-10-CM

## 2024-03-17 DIAGNOSIS — O99413 Diseases of the circulatory system complicating pregnancy, third trimester: Secondary | ICD-10-CM

## 2024-03-17 DIAGNOSIS — O99019 Anemia complicating pregnancy, unspecified trimester: Secondary | ICD-10-CM

## 2024-03-17 LAB — CBC
Hematocrit: 35.2 % (ref 34.0–46.6)
Hemoglobin: 11.6 g/dL (ref 11.1–15.9)
MCH: 31.2 pg (ref 26.6–33.0)
MCHC: 33 g/dL (ref 31.5–35.7)
MCV: 95 fL (ref 79–97)
Platelets: 206 x10E3/uL (ref 150–450)
RBC: 3.72 x10E6/uL — ABNORMAL LOW (ref 3.77–5.28)
RDW: 15.8 % — ABNORMAL HIGH (ref 11.7–15.4)
WBC: 9.3 x10E3/uL (ref 3.4–10.8)

## 2024-03-17 MED ORDER — ABRYSVO 120 MCG/0.5ML IM SOLR
0.5000 mL | Freq: Once | INTRAMUSCULAR | 0 refills | Status: AC
  Filled 2024-03-17: qty 0.5, 1d supply, fill #0

## 2024-03-17 NOTE — Progress Notes (Signed)
   PRENATAL VISIT NOTE  Subjective:  Hannah Esparza is a 23 y.o. G1P0 at [redacted]w[redacted]d being seen today for ongoing prenatal care.  She is currently monitored for the following issues for this low-risk pregnancy and has Wolff-Parkinson-White syndrome; Supervision of normal first pregnancy; Sciatica of right side; and Iron  deficiency anemia of pregnancy on their problem list.  Patient reports no complaints.  Contractions: Not present. Vag. Bleeding: None.  Movement: Present. Denies leaking of fluid.   The following portions of the patient's history were reviewed and updated as appropriate: allergies, current medications, past family history, past medical history, past social history, past surgical history and problem list.   Objective:    Vitals:   03/17/24 0924  BP: 129/73  Pulse: 93  Weight: 164 lb (74.4 kg)    Fetal Status:  Fetal Heart Rate (bpm): 151 Fundal Height: 37 cm Movement: Present Presentation: Vertex  General: Alert, oriented and cooperative. Patient is in no acute distress.  Skin: Skin is warm and dry. No rash noted.   Cardiovascular: Normal heart rate noted  Respiratory: Normal respiratory effort, no problems with respiration noted  Abdomen: Soft, gravid, appropriate for gestational age.  Pain/Pressure: Absent (pelvic pressure and pain)     Pelvic: Cervical exam performed in the presence of a chaperone Dilation: 1 (tight) Effacement (%): 20 Station: -3  Extremities: Normal range of motion.  Edema: None  Mental Status: Normal mood and affect. Normal behavior. Normal judgment and thought content.   Assessment and Plan:  Pregnancy: G1P0 at [redacted]w[redacted]d 1. Encounter for supervision of normal first pregnancy in third trimester (Primary) - recheck 1 week - on PNV - Culture, beta strep (group b only) - Cervicovaginal ancillary only( [place])  2. [redacted] weeks gestation of pregnancy)  3. Anemia during pregnancy - has received 5 iron  infusions.  Will recheck CBC today - CBC  4.  Wolff-Parkinson-White syndrome - followed by Dr. Sheena - Echo normla on 8/5 - pp stress test recommended  Preterm labor symptoms and general obstetric precautions including but not limited to vaginal bleeding, contractions, leaking of fluid and fetal movement were reviewed in detail with the patient. Please refer to After Visit Summary for other counseling recommendations.   Return in about 1 week (around 03/24/2024).  Future Appointments  Date Time Provider Department Center  03/24/2024  9:15 AM Tad Arland POUR, CNM [facility] [address]  03/31/2024  9:15 AM Cleotilde Ronal RAMAN, MD [facility] [phone] [facility]  04/08/2024  9:35 AM Delores Nidia CROME, FNP [facility] [address]  04/15/2024 10:55 AM Delores Nidia CROME, FNP [facility] [address]  05/25/2024 10:00 AM Sheena Pugh, DO [facility]    Ronal RAMAN Cleotilde, MD

## 2024-03-18 ENCOUNTER — Encounter (HOSPITAL_BASED_OUTPATIENT_CLINIC_OR_DEPARTMENT_OTHER): Payer: Self-pay

## 2024-03-18 ENCOUNTER — Encounter (HOSPITAL_BASED_OUTPATIENT_CLINIC_OR_DEPARTMENT_OTHER): Payer: Self-pay | Admitting: Obstetrics & Gynecology

## 2024-03-18 ENCOUNTER — Ambulatory Visit (HOSPITAL_BASED_OUTPATIENT_CLINIC_OR_DEPARTMENT_OTHER): Payer: Self-pay | Admitting: Obstetrics & Gynecology

## 2024-03-18 LAB — CERVICOVAGINAL ANCILLARY ONLY
Chlamydia: NEGATIVE
Comment: NEGATIVE
Comment: NORMAL
Neisseria Gonorrhea: NEGATIVE

## 2024-03-21 LAB — CULTURE, BETA STREP (GROUP B ONLY): Strep Gp B Culture: NEGATIVE

## 2024-03-24 ENCOUNTER — Ambulatory Visit (HOSPITAL_BASED_OUTPATIENT_CLINIC_OR_DEPARTMENT_OTHER): Admitting: Certified Nurse Midwife

## 2024-03-24 VITALS — BP 124/85 | HR 94 | Wt 164.8 lb

## 2024-03-24 DIAGNOSIS — I456 Pre-excitation syndrome: Secondary | ICD-10-CM | POA: Diagnosis not present

## 2024-03-24 DIAGNOSIS — Z3A37 37 weeks gestation of pregnancy: Secondary | ICD-10-CM

## 2024-03-24 DIAGNOSIS — O99013 Anemia complicating pregnancy, third trimester: Secondary | ICD-10-CM

## 2024-03-24 DIAGNOSIS — D509 Iron deficiency anemia, unspecified: Secondary | ICD-10-CM

## 2024-03-24 DIAGNOSIS — O99413 Diseases of the circulatory system complicating pregnancy, third trimester: Secondary | ICD-10-CM

## 2024-03-24 DIAGNOSIS — Z3403 Encounter for supervision of normal first pregnancy, third trimester: Secondary | ICD-10-CM

## 2024-03-24 NOTE — Progress Notes (Signed)
   PRENATAL VISIT NOTE  Subjective:  Hannah Esparza is a 23 y.o. G1P0 at [redacted]w[redacted]d being seen today for ongoing prenatal care.  She is currently monitored for the following issues for this  pregnancy and has Wolff-Parkinson-White syndrome; Supervision of normal first pregnancy; Sciatica of right side; and Iron  deficiency anemia of pregnancy on their problem list.  Patient reports no complaints.  Contractions: Not present. Vag. Bleeding: None.  Movement: Present. Denies leaking of fluid.   The following portions of the patient's history were reviewed and updated as appropriate: allergies, current medications, past family history, past medical history, past social history, past surgical history and problem list.   Objective:    Vitals:   03/24/24 0926  BP: 125/89  Pulse: 94  Weight: 164 lb 12.8 oz (74.8 kg)    Fetal Status:  Fetal Heart Rate (bpm): 120 Fundal Height: 36 cm Movement: Present Presentation: Vertex  General: Alert, oriented and cooperative. Patient is in no acute distress.  Skin: Skin is warm and dry. No rash noted.   Cardiovascular: Normal heart rate noted  Respiratory: Normal respiratory effort, no problems with respiration noted  Abdomen: Soft, gravid, appropriate for gestational age.  Pain/Pressure: Present     Pelvic: Cervical exam performed in the presence of a chaperone Dilation: Fingertip Effacement (%): 20    Extremities: Normal range of motion.  Edema: Trace  Mental Status: Normal mood and affect. Normal behavior. Normal judgment and thought content.   Assessment and Plan:  Pregnancy: G1P0 at 110w0d  1. Encounter for supervision of normal first pregnancy in third trimester (Primary) - recheck 1 week - on PNV - GBS Negative, GC/CT Negative   2. [redacted] weeks gestation of pregnancy   3. Anemia during pregnancy - has received 5 iron  infusions.  - Hg (03/17/24): 11.6   4. Wolff-Parkinson-White syndrome - followed by Dr. Sheena - Echo normla on 8/5 - pp stress test  recommended   Term labor symptoms and general obstetric precautions including but not limited to vaginal bleeding, contractions, leaking of fluid and fetal movement were reviewed in detail with the patient. Please refer to After Visit Summary for other counseling recommendations.   No follow-ups on file.  Future Appointments  Date Time Provider Department Center  03/31/2024  9:15 AM Cleotilde Ronal RAMAN, MD [facility] [address]  04/08/2024  9:35 AM Delores Nidia CROME, FNP [facility] [address]  04/15/2024 10:55 AM Delores Nidia CROME, FNP [facility] [address]  05/25/2024 10:00 AM Tobb, Dub, DO [facility]    Arland MARLA Roller, CNM

## 2024-03-25 ENCOUNTER — Encounter (HOSPITAL_BASED_OUTPATIENT_CLINIC_OR_DEPARTMENT_OTHER): Admitting: Obstetrics & Gynecology

## 2024-03-31 ENCOUNTER — Ambulatory Visit (HOSPITAL_BASED_OUTPATIENT_CLINIC_OR_DEPARTMENT_OTHER): Admitting: Obstetrics & Gynecology

## 2024-03-31 VITALS — BP 125/77 | HR 86 | Wt 174.0 lb

## 2024-03-31 DIAGNOSIS — I456 Pre-excitation syndrome: Secondary | ICD-10-CM

## 2024-03-31 DIAGNOSIS — Z3A38 38 weeks gestation of pregnancy: Secondary | ICD-10-CM

## 2024-03-31 DIAGNOSIS — D509 Iron deficiency anemia, unspecified: Secondary | ICD-10-CM

## 2024-03-31 DIAGNOSIS — O99413 Diseases of the circulatory system complicating pregnancy, third trimester: Secondary | ICD-10-CM

## 2024-03-31 DIAGNOSIS — O99013 Anemia complicating pregnancy, third trimester: Secondary | ICD-10-CM | POA: Diagnosis not present

## 2024-03-31 DIAGNOSIS — Z3403 Encounter for supervision of normal first pregnancy, third trimester: Secondary | ICD-10-CM

## 2024-03-31 NOTE — Progress Notes (Unsigned)
   PRENATAL VISIT NOTE  Subjective:  Hannah Esparza is a 23 y.o. G1P0 at [redacted]w[redacted]d being seen today for ongoing prenatal care.  She is currently monitored for the following issues for this {Blank single:19197::high-risk,low-risk} pregnancy and has Wolff-Parkinson-White syndrome; Supervision of normal first pregnancy; and Iron  deficiency anemia of pregnancy on their problem list.  Patient reports {sx:14538}.  Contractions: Irregular. Vag. Bleeding: None.  Movement: Present. Denies leaking of fluid.   The following portions of the patient's history were reviewed and updated as appropriate: allergies, current medications, past family history, past medical history, past social history, past surgical history and problem list.   Objective:   Vitals:   03/31/24 0934  BP: 125/77  Pulse: 86  Weight: 174 lb (78.9 kg)    Fetal Status: Fetal Heart Rate (bpm): 137 Fundal Height: 38 cm  Movement: Present Presentation: Vertex  General:  Alert, oriented and cooperative. Patient is in no acute distress.  Skin: Skin is warm and dry. No rash noted.   Cardiovascular: Normal heart rate noted  Respiratory: Normal respiratory effort, no problems with respiration noted  Abdomen: Soft, gravid, appropriate for gestational age.  Pain/Pressure: Present (pelvic)     Pelvic: {Blank single:19197::Cervical exam performed in the presence of a chaperone,Cervical exam deferred} Dilation: 1 Effacement (%): 50 Station: -3  Extremities: Normal range of motion.  Edema: Trace (feet and ankles)  Mental Status: Normal mood and affect. Normal behavior. Normal judgment and thought content.   Assessment and Plan:  Pregnancy: G1P0 at [redacted]w[redacted]d 1. Encounter for supervision of normal first pregnancy in third trimester (Primary) ***  {Blank single:19197::Term,Preterm} labor symptoms and general obstetric precautions including but not limited to vaginal bleeding, contractions, leaking of fluid and fetal movement were reviewed  in detail with the patient. Please refer to After Visit Summary for other counseling recommendations.   No follow-ups on file.  Future Appointments  Date Time Provider Department Center  04/08/2024  9:35 AM Delores Nidia CROME, FNP [facility] [address]  04/15/2024 10:55 AM Delores Nidia CROME, FNP [facility] [address]  05/25/2024 10:00 AM Tobb, Kardie, DO [facility]    Ronal GORMAN Pinal, MD

## 2024-04-01 ENCOUNTER — Encounter (HOSPITAL_BASED_OUTPATIENT_CLINIC_OR_DEPARTMENT_OTHER): Payer: Self-pay | Admitting: Obstetrics & Gynecology

## 2024-04-04 ENCOUNTER — Encounter (HOSPITAL_COMMUNITY): Payer: Self-pay | Admitting: Obstetrics and Gynecology

## 2024-04-04 ENCOUNTER — Inpatient Hospital Stay (HOSPITAL_COMMUNITY)
Admission: AD | Admit: 2024-04-04 | Discharge: 2024-04-07 | DRG: 807 | Disposition: A | Discharge status: Home / Self Care | Attending: Obstetrics and Gynecology | Admitting: Obstetrics and Gynecology

## 2024-04-04 ENCOUNTER — Other Ambulatory Visit: Payer: Self-pay

## 2024-04-04 DIAGNOSIS — O9942 Diseases of the circulatory system complicating childbirth: Secondary | ICD-10-CM | POA: Diagnosis present

## 2024-04-04 DIAGNOSIS — Z7982 Long term (current) use of aspirin: Secondary | ICD-10-CM

## 2024-04-04 DIAGNOSIS — O9902 Anemia complicating childbirth: Secondary | ICD-10-CM | POA: Diagnosis present

## 2024-04-04 DIAGNOSIS — D509 Iron deficiency anemia, unspecified: Secondary | ICD-10-CM | POA: Diagnosis present

## 2024-04-04 DIAGNOSIS — Z87891 Personal history of nicotine dependence: Secondary | ICD-10-CM | POA: Diagnosis not present

## 2024-04-04 DIAGNOSIS — O26893 Other specified pregnancy related conditions, third trimester: Secondary | ICD-10-CM | POA: Diagnosis not present

## 2024-04-04 DIAGNOSIS — O139 Gestational [pregnancy-induced] hypertension without significant proteinuria, unspecified trimester: Secondary | ICD-10-CM | POA: Diagnosis present

## 2024-04-04 DIAGNOSIS — O133 Gestational [pregnancy-induced] hypertension without significant proteinuria, third trimester: Principal | ICD-10-CM

## 2024-04-04 DIAGNOSIS — Z3A38 38 weeks gestation of pregnancy: Secondary | ICD-10-CM

## 2024-04-04 DIAGNOSIS — I456 Pre-excitation syndrome: Secondary | ICD-10-CM | POA: Diagnosis present

## 2024-04-04 DIAGNOSIS — O134 Gestational [pregnancy-induced] hypertension without significant proteinuria, complicating childbirth: Principal | ICD-10-CM | POA: Diagnosis present

## 2024-04-04 LAB — PROTEIN / CREATININE RATIO, URINE
Creatinine, Urine: 76 mg/dL
Protein Creatinine Ratio: 0.11 mg/mg{creat} (ref 0.00–0.15)
Total Protein, Urine: 8 mg/dL

## 2024-04-04 LAB — COMPREHENSIVE METABOLIC PANEL WITH GFR
ALT: 23 U/L (ref 0–44)
AST: 25 U/L (ref 15–41)
Albumin: 3 g/dL — ABNORMAL LOW (ref 3.5–5.0)
Alkaline Phosphatase: 202 U/L — ABNORMAL HIGH (ref 38–126)
Anion gap: 13 (ref 5–15)
BUN: 7 mg/dL (ref 6–20)
CO2: 19 mmol/L — ABNORMAL LOW (ref 22–32)
Calcium: 9.7 mg/dL (ref 8.9–10.3)
Chloride: 103 mmol/L (ref 98–111)
Creatinine, Ser: 0.7 mg/dL (ref 0.44–1.00)
GFR, Estimated: >60 mL/min (ref >60)
Glucose, Bld: 75 mg/dL (ref 70–99)
Potassium: 3.7 mmol/L (ref 3.5–5.1)
Sodium: 135 mmol/L (ref 135–145)
Total Bilirubin: 0.6 mg/dL (ref 0.0–1.2)
Total Protein: 6.3 g/dL — ABNORMAL LOW (ref 6.5–8.1)

## 2024-04-04 LAB — TYPE AND SCREEN
ABO/RH(D): B POS
Antibody Screen: NEGATIVE

## 2024-04-04 LAB — CBC
HCT: 35.8 % — ABNORMAL LOW (ref 36.0–46.0)
HCT: 37.4 % (ref 36.0–46.0)
Hemoglobin: 12 g/dL (ref 12.0–15.0)
Hemoglobin: 12.6 g/dL (ref 12.0–15.0)
MCH: 30.6 pg (ref 26.0–34.0)
MCH: 30.9 pg (ref 26.0–34.0)
MCHC: 33.5 g/dL (ref 30.0–36.0)
MCHC: 33.7 g/dL (ref 30.0–36.0)
MCV: 91.3 fL (ref 80.0–100.0)
MCV: 91.7 fL (ref 80.0–100.0)
Platelets: 163 K/uL (ref 150–400)
Platelets: 171 K/uL (ref 150–400)
RBC: 3.92 MIL/uL (ref 3.87–5.11)
RBC: 4.08 MIL/uL (ref 3.87–5.11)
RDW: 14.7 % (ref 11.5–15.5)
RDW: 14.6 % (ref 11.5–15.5)
WBC: 10.4 K/uL (ref 4.0–10.5)
WBC: 10.1 K/uL (ref 4.0–10.5)
nRBC: 0 % (ref 0.0–0.2)
nRBC: 0 % (ref 0.0–0.2)

## 2024-04-04 MED ORDER — ONDANSETRON HCL 4 MG/2ML IJ SOLN: 4.0000 mg | Freq: Four times a day (QID) | INTRAMUSCULAR | Status: DC | PRN

## 2024-04-04 MED ORDER — SOD CITRATE-CITRIC ACID 500-334 MG/5ML PO SOLN
30.0000 mL | ORAL | Status: DC | PRN
  Filled 2024-04-04: qty 30

## 2024-04-04 MED ORDER — POTASSIUM CHLORIDE CRYS ER 20 MEQ PO TBCR
30.0000 meq | EXTENDED_RELEASE_TABLET | Freq: Two times a day (BID) | ORAL | Status: AC
  Administered 2024-04-05: 30 meq via ORAL
  Filled 2024-04-04 (×2): qty 1

## 2024-04-04 MED ORDER — LACTATED RINGERS IV SOLN: 500.0000 mL | INTRAVENOUS | Status: DC | PRN

## 2024-04-04 MED ORDER — LIDOCAINE HCL (PF) 1 % IJ SOLN: 30.0000 mL | INTRAMUSCULAR | Status: DC | PRN

## 2024-04-04 MED ORDER — OXYTOCIN BOLUS FROM INFUSION
333.0000 mL | Freq: Once | INTRAVENOUS | Status: AC
  Administered 2024-04-05: 333 mL via INTRAVENOUS

## 2024-04-04 MED ORDER — OXYTOCIN-SODIUM CHLORIDE 30-0.9 UT/500ML-% IV SOLN: 2.5000 [IU]/h | INTRAVENOUS | Status: DC

## 2024-04-04 MED ORDER — FENTANYL CITRATE (PF) 100 MCG/2ML IJ SOLN: 50.0000 ug | INTRAMUSCULAR | Status: DC | PRN

## 2024-04-04 MED ORDER — POTASSIUM CHLORIDE CRYS ER 10 MEQ PO TBCR
30.0000 meq | EXTENDED_RELEASE_TABLET | Freq: Two times a day (BID) | ORAL | Status: DC
  Filled 2024-04-04: qty 1

## 2024-04-04 MED ORDER — ACETAMINOPHEN 325 MG PO TABS: 650.0000 mg | ORAL_TABLET | ORAL | Status: DC | PRN

## 2024-04-04 MED ORDER — OXYCODONE-ACETAMINOPHEN 5-325 MG PO TABS: 2.0000 | ORAL_TABLET | ORAL | Status: DC | PRN

## 2024-04-04 MED ORDER — LACTATED RINGERS IV SOLN: INTRAVENOUS | Status: DC

## 2024-04-04 MED ORDER — OXYCODONE-ACETAMINOPHEN 5-325 MG PO TABS: 1.0000 | ORAL_TABLET | ORAL | Status: DC | PRN

## 2024-04-04 NOTE — MAU Provider Note (Signed)
 None     S [name] is a 23 y.o. G1P0 female at [redacted]w[redacted]d who presents to MAU today with complaint of contractions. She reports they have been getting stronger and closer together, now 6 minutes apart. During RN labor eval, provider made aware of mildly elevated BP. Patient denies headache, vision changes, RUQ pain.   Receives care at [facility]. Prenatal records reviewed.  Pertinent items noted in HPI and remainder of comprehensive ROS otherwise negative.   O BP 130/87   Pulse 90   Temp 97.8 F (36.6 C) (Oral)   Resp 17   Ht 5' 5 (1.651 m)   Wt 78.9 kg   LMP 07/09/2023 (Approximate)   SpO2 99%   BMI 28.94 kg/m  Physical Exam Vitals reviewed.  Constitutional:      General: She is not in acute distress.    Appearance: She is well-developed. She is not diaphoretic.  Eyes:     General: No scleral icterus. Pulmonary:     Effort: Pulmonary effort is normal. No respiratory distress.  Skin:    General: Skin is warm and dry.  Neurological:     Mental Status: She is alert.     Coordination: Coordination normal.   Dilation: 1.5 Effacement (%): 70 Cervical Position: Middle Station: -3 Exam by:: Powell Sprague, RN   Results for orders placed or performed during the hospital encounter of 04/04/24 (from the past 24 hours)  Protein / creatinine ratio, urine     Status: None   Collection Time: 04/04/24  8:51 PM  Result Value Ref Range   Creatinine, Urine 76 mg/dL   Total Protein, Urine 8 mg/dL   Protein Creatinine Ratio 0.11 0.00 - 0.15 mg/mg[Cre]  CBC     Status: Abnormal   Collection Time: 04/04/24  9:16 PM  Result Value Ref Range   WBC 10.4 4.0 - 10.5 K/uL   RBC 3.92 3.87 - 5.11 MIL/uL   Hemoglobin 12.0 12.0 - 15.0 g/dL   HCT 64.1 (L) 63.9 - 53.9 %   MCV 91.3 80.0 - 100.0 fL   MCH 30.6 26.0 - 34.0 pg   MCHC 33.5 30.0 - 36.0 g/dL   RDW 85.2 88.4 - 84.4 %   Platelets 163 150 - 400 K/uL   nRBC 0.0 0.0 - 0.2 %  Comprehensive metabolic panel with GFR     Status:  Abnormal   Collection Time: 04/04/24  9:16 PM  Result Value Ref Range   Sodium 135 135 - 145 mmol/L   Potassium 3.7 3.5 - 5.1 mmol/L   Chloride 103 98 - 111 mmol/L   CO2 19 (L) 22 - 32 mmol/L   Glucose, Bld 75 70 - 99 mg/dL   BUN 7 6 - 20 mg/dL   Creatinine, Ser 9.29 0.44 - 1.00 mg/dL   Calcium 9.7 8.9 - 89.6 mg/dL   Total Protein 6.3 (L) 6.5 - 8.1 g/dL   Albumin 3.0 (L) 3.5 - 5.0 g/dL   AST 25 15 - 41 U/L   ALT 23 0 - 44 U/L   Alkaline Phosphatase 202 (H) 38 - 126 U/L   Total Bilirubin 0.6 0.0 - 1.2 mg/dL   GFR, Estimated >39 >39 mL/min   Anion gap 13 5 - 15    MDM: Moderate MAU Course: -Mildly elevated BP. CMP, CBC, urine protein/creatinine ratio to rule out preeclampsia. -CBC, CMP, urine protein/creatinine ratio within normal limits. -BP has remained mildly elevated. Discussed with Dr. Izell, recommend offering admission for gestational HTN.  A 1. Gestational hypertension, third trimester (Primary)  2. Iron  deficiency anemia of pregnancy  3. [redacted] weeks gestation of pregnancy  4. Indication for care/intervention related to labor/delivery, antepartum  Medical screening exam complete  P Admit to L&D for gestational HTN.  Future Appointments  Date Time Provider Department Center  04/08/2024  9:35 AM Delores Nidia CROME, FNP [facility] [address]  04/15/2024 10:55 AM Delores Nidia CROME, FNP [facility] [address]  05/25/2024 10:00 AM Sheena, Kardie, DO [facility]   Allergies as of 04/04/2024   (No Known Allergies)     Joesph DELENA Sear, PA

## 2024-04-04 NOTE — H&P (Signed)
 OBSTETRIC ADMISSION HISTORY AND PHYSICAL  Hannah Esparza is a 23 y.o. female G1P0 with IUP at [redacted]w[redacted]d (dated by LMP, Estimated Date of Delivery: 04/14/24) presenting for contractions. Found to be in false labor in the MAU but BP's elevated to mild ranges. PIH labs negative. Offered IOL for newly diagnosed gHTN and patient agreed.    She reports +FMs, No LOF, no VB, no blurry vision, headaches or peripheral edema, and RUQ pain.    She plans on breast feeding. She request POPs for birth control.  She received her prenatal care at [facility]   Prenatal History/Complications:   Wolf-Parkinson-White  Past Medical History: Past Medical History:  Diagnosis Date   Viral warts 06/07/2008   Qualifier: Diagnosis of   By: Johnny MD, Garnette LABOR     IMO SNOMED Dx Update Oct 2024     Wolff-Parkinson-White syndrome     Past Surgical History: Past Surgical History:  Procedure Laterality Date   NO PAST SURGERIES      Obstetrical History: OB History     Gravida  1   Para      Term      Preterm      AB      Living         SAB      IAB      Ectopic      Multiple      Live Births              Social History Social History   Socioeconomic History   Marital status: Single    Spouse name: Not on file   Number of children: Not on file   Years of education: Not on file   Highest education level: Not on file  Occupational History   Not on file  Tobacco Use   Smoking status: Former    Current packs/day: 0.00    Types: Cigarettes    Quit date: 08/16/2023    Years since quitting: 0.6    Passive exposure: Yes   Smokeless tobacco: Never  Vaping Use   Vaping status: Never Used  Substance and Sexual Activity   Alcohol use: No   Drug use: No   Sexual activity: Yes  Other Topics Concern   Not on file  Social History Narrative   Not on file   Social Drivers of Health   Financial Resource Strain: Medium Risk (09/17/2023)   Overall Financial Resource Strain (CARDIA)     Difficulty of Paying Living Expenses: Somewhat hard  Food Insecurity: Food Insecurity Present (09/17/2023)   Hunger Vital Sign    Worried About Running Out of Food in the Last Year: Often true    Ran Out of Food in the Last Year: Often true  Transportation Needs: Unmet Transportation Needs (09/17/2023)   PRAPARE - Transportation    Lack of Transportation (Medical): Yes    Lack of Transportation (Non-Medical): Yes  Physical Activity: Insufficiently Active (09/17/2023)   Exercise Vital Sign    Days of Exercise per Week: 3 days    Minutes of Exercise per Session: 10 min  Stress: Stress Concern Present (09/17/2023)   [employer] of Occupational Health - Occupational Stress Questionnaire    Feeling of Stress : To some extent  Social Connections: Moderately Integrated (09/17/2023)   Social Connection and Isolation Panel    Frequency of Communication with Friends and Family: More than three times a week    Frequency of Social Gatherings with Friends and Family: Three times  a week    Attends Religious Services: More than 4 times per year    Active Member of Clubs or Organizations: No    Attends Banker Meetings: Never    Marital Status: Living with partner    Family History: History reviewed. No pertinent family history.  Allergies: No Known Allergies  Medications Prior to Admission  Medication Sig Dispense Refill Last Dose/Taking   aspirin  EC 81 MG tablet Take 1 tablet (81 mg total) by mouth daily. Swallow whole.   04/04/2024   Prenatal MV & Min w/FA-DHA (PRENATAL ADULT GUMMY/DHA/FA PO) Take by mouth.   04/04/2024   Blood Pressure Monitor DEVI Needs small cuff 1 each 0    Blood Pressure Monitoring (BLOOD PRESSURE MONITOR AUTOMAT) DEVI 1 Units by Does not apply route once for 1 dose. 1 each 0    cyclobenzaprine  (FLEXERIL ) 5 MG tablet Take 1 tablet (5 mg total) by mouth 3 (three) times daily as needed. 20 tablet 0 More than a month   ondansetron  (ZOFRAN ) 8 MG tablet Take 1  tablet (8 mg total) by mouth every 8 (eight) hours as needed for nausea or vomiting. 5 tablet 0 More than a month     Review of Systems  All systems reviewed and negative except as stated in HPI.  Blood pressure 130/87, pulse 90, temperature 97.8 F (36.6 C), temperature source Oral, resp. rate 17, height 5' 5 (1.651 m), weight 78.9 kg, last menstrual period 07/09/2023, SpO2 99%. General appearance: alert, cooperative, and appears stated age Lungs: breathing comfortably on room air Heart: regular rate Abdomen: soft, non-tender; gravid Extremities: no edema of bilateral lower extremities DTR's intact Presentation: cephalic Fetal monitoringBaseline: 125 bpm, Variability: Good {> 6 bpm), Accelerations: Reactive, and Decelerations: Absent Uterine activityFrequency: Every 7 minutes  Dilation: 1.5 Effacement (%): 70 Station: -3 Exam by:: Powell Sprague, RN   Prenatal labs: ABO, Rh: B/Positive/-- (03/18 1010) Antibody: Negative (04/16 1346) Rubella: 2.52 (04/16 1346) RPR: Non Reactive (08/14 0822)  HBsAg: Negative (04/16 1346)  HIV: Non Reactive (08/14 9177)  GBS: Negative/-- (10/15 1005)  2 hr Glucola normal Genetic screening  LR Female Anatomy US  Appears normal Last US : At [redacted]w[redacted]d - cephalic presentation, EFW 1060g (51 %tile), AC 34%ile  Prenatal Transfer Tool  Maternal Diabetes: No Genetic Screening: Normal Maternal Ultrasounds/Referrals: Normal Fetal Ultrasounds or other Referrals:  None Maternal Substance Abuse:  No Significant Maternal Medications:  None Significant Maternal Lab Results:  Group B Strep negative Number of Prenatal Visits:greater than 3 verified prenatal visits Other Comments:  None  Results for orders placed or performed during the hospital encounter of 04/04/24 (from the past 24 hours)  Protein / creatinine ratio, urine   Collection Time: 04/04/24  8:51 PM  Result Value Ref Range   Creatinine, Urine 76 mg/dL   Total Protein, Urine 8 mg/dL   Protein  Creatinine Ratio 0.11 0.00 - 0.15 mg/mg[Cre]  CBC   Collection Time: 04/04/24  9:16 PM  Result Value Ref Range   WBC 10.4 4.0 - 10.5 K/uL   RBC 3.92 3.87 - 5.11 MIL/uL   Hemoglobin 12.0 12.0 - 15.0 g/dL   HCT 64.1 (L) 63.9 - 53.9 %   MCV 91.3 80.0 - 100.0 fL   MCH 30.6 26.0 - 34.0 pg   MCHC 33.5 30.0 - 36.0 g/dL   RDW 85.2 88.4 - 84.4 %   Platelets 163 150 - 400 K/uL   nRBC 0.0 0.0 - 0.2 %  Comprehensive metabolic panel with GFR  Collection Time: 04/04/24  9:16 PM  Result Value Ref Range   Sodium 135 135 - 145 mmol/L   Potassium 3.7 3.5 - 5.1 mmol/L   Chloride 103 98 - 111 mmol/L   CO2 19 (L) 22 - 32 mmol/L   Glucose, Bld 75 70 - 99 mg/dL   BUN 7 6 - 20 mg/dL   Creatinine, Ser 9.29 0.44 - 1.00 mg/dL   Calcium 9.7 8.9 - 89.6 mg/dL   Total Protein 6.3 (L) 6.5 - 8.1 g/dL   Albumin 3.0 (L) 3.5 - 5.0 g/dL   AST 25 15 - 41 U/L   ALT 23 0 - 44 U/L   Alkaline Phosphatase 202 (H) 38 - 126 U/L   Total Bilirubin 0.6 0.0 - 1.2 mg/dL   GFR, Estimated >39 >39 mL/min   Anion gap 13 5 - 15    Patient Active Problem List   Diagnosis Date Noted   Iron  deficiency anemia of pregnancy 01/26/2024   Supervision of normal first pregnancy 09/24/2023   Wolff-Parkinson-White syndrome 03/06/2021    Assessment/Plan:  Kalianna Verbeke is a 23 y.o. G1P0 at [redacted]w[redacted]d here for IOL due to newly diagnosed gHTN  #Labor: IOL process explained in detail to the patient. #Pain: Per patient request #FWB: Cat I #ID:  GBS (-) #MOF: Breast #MOC: POPs #Circ:  Yes  Barkley Angles, MD OB Fellow, Faculty Practice [facility], Center for [facility] 04/04/2024 10:22 PM

## 2024-04-04 NOTE — MAU Note (Signed)
 Hannah Esparza is a 23 y.o. at [redacted]w[redacted]d here in MAU reporting:   Ctxs all day but this afternoon they have gotten stronger and closer. 6 min apart.  No lof, no bleeding. +Fm.  6/10 lower pelvic area ctxs pain.   1cm last CE.   Vitals:   04/04/24 2015  BP: 132/89  Pulse: 82  Resp: 17  Temp: 97.8 F (36.6 C)  SpO2: 97%    FHT: 153 Lab orders placed from triage: labor eval.

## 2024-04-05 ENCOUNTER — Inpatient Hospital Stay (HOSPITAL_COMMUNITY): Admitting: Anesthesiology

## 2024-04-05 ENCOUNTER — Encounter (HOSPITAL_COMMUNITY): Payer: Self-pay | Admitting: Obstetrics and Gynecology

## 2024-04-05 DIAGNOSIS — Z3A38 38 weeks gestation of pregnancy: Secondary | ICD-10-CM

## 2024-04-05 DIAGNOSIS — O134 Gestational [pregnancy-induced] hypertension without significant proteinuria, complicating childbirth: Secondary | ICD-10-CM

## 2024-04-05 LAB — CBC
HCT: 34.9 % — ABNORMAL LOW (ref 36.0–46.0)
Hemoglobin: 12 g/dL (ref 12.0–15.0)
MCH: 31.5 pg (ref 26.0–34.0)
MCHC: 34.4 g/dL (ref 30.0–36.0)
MCV: 91.6 fL (ref 80.0–100.0)
Platelets: 168 K/uL (ref 150–400)
RBC: 3.81 MIL/uL — ABNORMAL LOW (ref 3.87–5.11)
RDW: 14.8 % (ref 11.5–15.5)
WBC: 14.4 K/uL — ABNORMAL HIGH (ref 4.0–10.5)
nRBC: 0 % (ref 0.0–0.2)

## 2024-04-05 LAB — RPR: RPR Ser Ql: NONREACTIVE

## 2024-04-05 MED ORDER — PHENYLEPHRINE 80 MCG/ML (10ML) SYRINGE FOR IV PUSH (FOR BLOOD PRESSURE SUPPORT): 80.0000 ug | PREFILLED_SYRINGE | INTRAVENOUS | Status: DC | PRN

## 2024-04-05 MED ORDER — EPHEDRINE 5 MG/ML INJ: 10.0000 mg | INTRAVENOUS | Status: DC | PRN

## 2024-04-05 MED ORDER — TERBUTALINE SULFATE 1 MG/ML IJ SOLN
0.2500 mg | Freq: Once | INTRAMUSCULAR | Status: AC
  Administered 2024-04-05: 0.25 mg via SUBCUTANEOUS

## 2024-04-05 MED ORDER — ONDANSETRON HCL 4 MG PO TABS: 4.0000 mg | ORAL_TABLET | ORAL | Status: DC | PRN

## 2024-04-05 MED ORDER — LACTATED RINGERS IV SOLN: 500.0000 mL | Freq: Once | INTRAVENOUS | Status: DC

## 2024-04-05 MED ORDER — LACTATED RINGERS IV SOLN
500.0000 mL | Freq: Once | INTRAVENOUS | Status: AC
  Administered 2024-04-05: 500 mL via INTRAVENOUS

## 2024-04-05 MED ORDER — TETANUS-DIPHTH-ACELL PERTUSSIS 5-2-15.5 LF-MCG/0.5 IM SUSP: 0.5000 mL | Freq: Once | INTRAMUSCULAR | Status: DC

## 2024-04-05 MED ORDER — DIBUCAINE (PERIANAL) 1 % EX OINT: 1.0000 | TOPICAL_OINTMENT | CUTANEOUS | Status: DC | PRN

## 2024-04-05 MED ORDER — TERBUTALINE SULFATE 1 MG/ML IJ SOLN
0.2500 mg | Freq: Once | INTRAMUSCULAR | Status: AC | PRN
  Administered 2024-04-05: 0.25 mg via SUBCUTANEOUS
  Filled 2024-04-05: qty 1

## 2024-04-05 MED ORDER — PRENATAL MULTIVITAMIN CH
1.0000 | ORAL_TABLET | Freq: Every day | ORAL | Status: DC
  Administered 2024-04-06 – 2024-04-07 (×2): 1 via ORAL
  Filled 2024-04-05 (×2): qty 1

## 2024-04-05 MED ORDER — ONDANSETRON HCL 4 MG/2ML IJ SOLN
INTRAMUSCULAR | Status: AC
  Filled 2024-04-05: qty 2

## 2024-04-05 MED ORDER — TERBUTALINE SULFATE 1 MG/ML IJ SOLN
INTRAMUSCULAR | Status: AC
  Filled 2024-04-05: qty 1

## 2024-04-05 MED ORDER — PHENYLEPHRINE 80 MCG/ML (10ML) SYRINGE FOR IV PUSH (FOR BLOOD PRESSURE SUPPORT)
80.0000 ug | PREFILLED_SYRINGE | INTRAVENOUS | Status: DC | PRN
  Administered 2024-04-05: 80 ug via INTRAVENOUS
  Filled 2024-04-05: qty 10

## 2024-04-05 MED ORDER — IBUPROFEN 600 MG PO TABS
600.0000 mg | ORAL_TABLET | Freq: Four times a day (QID) | ORAL | Status: DC
  Administered 2024-04-05 – 2024-04-07 (×8): 600 mg via ORAL
  Filled 2024-04-05 (×8): qty 1

## 2024-04-05 MED ORDER — DIPHENHYDRAMINE HCL 50 MG/ML IJ SOLN: 12.5000 mg | INTRAMUSCULAR | Status: DC | PRN

## 2024-04-05 MED ORDER — SENNOSIDES-DOCUSATE SODIUM 8.6-50 MG PO TABS
2.0000 | ORAL_TABLET | Freq: Every day | ORAL | Status: DC
  Administered 2024-04-06 – 2024-04-07 (×2): 2 via ORAL
  Filled 2024-04-05 (×2): qty 2

## 2024-04-05 MED ORDER — ZOLPIDEM TARTRATE 5 MG PO TABS: 5.0000 mg | ORAL_TABLET | Freq: Every evening | ORAL | Status: DC | PRN

## 2024-04-05 MED ORDER — BENZOCAINE-MENTHOL 20-0.5 % EX AERO
1.0000 | INHALATION_SPRAY | CUTANEOUS | Status: DC | PRN
  Administered 2024-04-05: 1 via TOPICAL
  Filled 2024-04-05: qty 56

## 2024-04-05 MED ORDER — LIDOCAINE-EPINEPHRINE (PF) 2 %-1:200000 IJ SOLN
INTRAMUSCULAR | Status: DC | PRN
  Administered 2024-04-05: 3 mL via EPIDURAL

## 2024-04-05 MED ORDER — OXYTOCIN-SODIUM CHLORIDE 30-0.9 UT/500ML-% IV SOLN
1.0000 m[IU]/min | INTRAVENOUS | Status: DC
  Administered 2024-04-05: 2 m[IU]/min via INTRAVENOUS
  Administered 2024-04-05: 1 m[IU]/min via INTRAVENOUS
  Filled 2024-04-05: qty 500

## 2024-04-05 MED ORDER — ONDANSETRON HCL 4 MG/2ML IJ SOLN
4.0000 mg | INTRAMUSCULAR | Status: DC | PRN
  Administered 2024-04-05: 4 mg via INTRAVENOUS

## 2024-04-05 MED ORDER — FENTANYL-BUPIVACAINE-NACL 0.5-0.125-0.9 MG/250ML-% EP SOLN: 12.0000 mL/h | EPIDURAL | Status: DC | PRN

## 2024-04-05 MED ORDER — FENTANYL-BUPIVACAINE-NACL 0.5-0.125-0.9 MG/250ML-% EP SOLN
12.0000 mL/h | EPIDURAL | Status: DC | PRN
  Administered 2024-04-05: 12 mL/h via EPIDURAL
  Filled 2024-04-05: qty 250

## 2024-04-05 MED ORDER — ACETAMINOPHEN 325 MG PO TABS
650.0000 mg | ORAL_TABLET | ORAL | Status: DC | PRN
  Administered 2024-04-06 – 2024-04-07 (×3): 650 mg via ORAL
  Filled 2024-04-05 (×3): qty 2

## 2024-04-05 MED ORDER — DIPHENHYDRAMINE HCL 25 MG PO CAPS: 25.0000 mg | ORAL_CAPSULE | Freq: Four times a day (QID) | ORAL | Status: DC | PRN

## 2024-04-05 MED ORDER — SIMETHICONE 80 MG PO CHEW: 80.0000 mg | CHEWABLE_TABLET | ORAL | Status: DC | PRN

## 2024-04-05 MED ORDER — COCONUT OIL OIL
1.0000 | TOPICAL_OIL | Status: DC | PRN
  Administered 2024-04-06 – 2024-04-07 (×2): 1 via TOPICAL

## 2024-04-05 MED ORDER — MORPHINE SULFATE (PF) 2 MG/ML IV SOLN
6.0000 mg | Freq: Once | INTRAVENOUS | Status: AC
  Administered 2024-04-05: 6 mg via INTRAMUSCULAR
  Filled 2024-04-05: qty 3

## 2024-04-05 MED ORDER — WITCH HAZEL-GLYCERIN EX PADS
1.0000 | MEDICATED_PAD | CUTANEOUS | Status: DC | PRN
  Administered 2024-04-05: 1 via TOPICAL

## 2024-04-05 NOTE — Discharge Summary (Signed)
 Postpartum Discharge Summary  Date of Service updated***     Patient Name: Hannah Esparza DOB: [birth year] MRN: [id number]  Date of admission: 04/04/2024 Delivery date:04/05/2024 Delivering provider: JOMARIE CAMPI A Date of discharge: 04/05/2024  Admitting diagnosis: Indication for care or intervention related to labor and delivery [O75.9] Intrauterine pregnancy: [redacted]w[redacted]d     Secondary diagnosis:  Principal Problem:   Indication for care or intervention related to labor and delivery  Additional problems: ***    Discharge diagnosis: Term Pregnancy Delivered and Gestational Hypertension                                              Post partum procedures:{Postpartum procedures:23558} Augmentation: AROM and Pitocin Complications: None  Hospital course: Induction of Labor With Vaginal Delivery   23 y.o. yo G1P1001 at [redacted]w[redacted]d was admitted to the hospital 04/04/2024 for induction of labor.  Indication for induction: Gestational hypertension.  Patient had an labor course complicated by tachysystole necessitating multiple doses of terbutaline. Patinet was able to fully dilate and push with the assistance of pitocin. Membrane Rupture Time/Date: 5:59 AM,04/05/2024  Delivery Method:Vaginal, Spontaneous Operative Delivery:{Operative Delivery:30121} Episiotomy: None Lacerations:  None Details of delivery can be found in separate delivery note.  Patient had a postpartum course complicated by***. Patient is discharged home 04/05/24.  Newborn Data: Birth date:04/05/2024 Birth time:4:48 PM Gender:Female Living status:Living Apgars:8 ,9  Weight:3480 g  Magnesium Sulfate received: {Mag received:30440022} BMZ received: {BMZ received:30440023} Rhophylac:{Rhophylac received:30440032} FFM:{FFM:69559966} T-DaP:{Tdap:23962} Flu: {Qol:76036} RSV Vaccine received: {RSV:31013} Transfusion:{Transfusion received:30440034}  Immunizations received: Immunization History  Administered Date(s) Administered     sv, Bivalent, Protein Subunit Rsvpref,pf (Abrysvo ) 03/17/2024   DTP 12/19/2000, 03/12/2001, 06/04/2001, 03/04/2002, 11/07/2005   HIB (PRP-OMP) 12/19/2000, 03/12/2001, 06/04/2001, 03/04/2002   Hepatitis B 2001-05-09, 03/12/2001, 06/04/2001   MMR 11/07/2005, 12/18/2005   OPV 12/19/2000, 03/12/2001, 01/05/2002, 11/07/2005   Pneumococcal Conjugate-13 06/04/2001, 01/05/2002, 03/04/2002   Varicella 03/04/2002, 11/07/2005    Physical exam  Vitals:   04/05/24 1635 04/05/24 1701 04/05/24 1720 04/05/24 1730  BP:  130/61 113/66 127/71  Pulse:  97 (!) 113 86  Resp:  17 17 17   Temp:      TempSrc:      SpO2: 97%     Weight:      Height:       General: {Exam; general:21111117} Lochia: {Desc; appropriate/inappropriate:30686::appropriate} Uterine Fundus: {Desc; firm/soft:30687} Incision: {Exam; incision:21111123} DVT Evaluation: {Exam; dvt:2111122} Labs: Lab Results  Component Value Date   WBC 10.1 04/04/2024   HGB 12.6 04/04/2024   HCT 37.4 04/04/2024   MCV 91.7 04/04/2024   PLT 171 04/04/2024      Latest Ref Rng & Units 04/04/2024    9:16 PM  CMP  Glucose 70 - 99 mg/dL 75   BUN 6 - 20 mg/dL 7   Creatinine 9.55 - 8.99 mg/dL 9.29   Sodium 864 - 854 mmol/L 135   Potassium 3.5 - 5.1 mmol/L 3.7   Chloride 98 - 111 mmol/L 103   CO2 22 - 32 mmol/L 19   Calcium 8.9 - 10.3 mg/dL 9.7   Total Protein 6.5 - 8.1 g/dL 6.3   Total Bilirubin 0.0 - 1.2 mg/dL 0.6   Alkaline Phos 38 - 126 U/L 202   AST 15 - 41 U/L 25   ALT 0 - 44 U/L 23    Edinburgh Score:  No data to display         No data recorded  After visit meds:  Allergies as of 04/05/2024   No Known Allergies   Med Rec must be completed prior to using this [facility]***        Discharge home in stable condition Infant Feeding: {Baby feeding:23562} Infant Disposition:{CHL IP OB HOME WITH FNUYZM:76418} Discharge instruction: per After Visit Summary and Postpartum booklet. Activity: Advance as tolerated. Pelvic  rest for 6 weeks.  Diet: {OB ipzu:78888878} Future Appointments: Future Appointments  Date Time Provider Department Center  04/08/2024  9:35 AM Delores Nidia CROME, FNP [facility] [address]  04/15/2024 10:55 AM Delores Nidia CROME, FNP [facility] [address]  05/25/2024 10:00 AM Tobb, Kardie, DO [facility]   Follow up Visit:   Please schedule this patient for a In person postpartum visit in 6 weeks with the following provider: Any provider. Additional Postpartum F/U:BP check 1 week  High risk pregnancy complicated by: HTN Delivery mode:  Vaginal, Spontaneous Anticipated Birth Control:  POPs  Message sent to [facility] 11/3  04/05/2024 Charlie DELENA Courts, MD

## 2024-04-05 NOTE — Anesthesia Procedure Notes (Signed)
 Epidural Patient location during procedure: OB Start time: 04/05/2024 4:12 AM End time: 04/05/2024 4:18 AM  Staffing Anesthesiologist: Tilford Franky BIRCH, MD Performed: anesthesiologist   Preanesthetic Checklist Completed: patient identified, IV checked, site marked, risks and benefits discussed, surgical consent, monitors and equipment checked, pre-op evaluation and timeout performed  Epidural Patient position: sitting Prep: DuraPrep Patient monitoring: heart rate, continuous pulse ox and blood pressure Approach: midline Location: L3-L4 Injection technique: LOR saline  Needle:  Needle type: Tuohy  Needle gauge: 17 G Needle length: 9 cm Catheter type: closed end flexible Catheter size: 20 Guage Test dose: negative and 1.5% lidocaine  Assessment Events: blood not aspirated, no cerebrospinal fluid, injection not painful, no injection resistance and no paresthesia  Additional Notes LOR @ 4.5  Patient identified. Risks/Benefits/Options discussed with patient including but not limited to bleeding, infection, nerve damage, paralysis, failed block, incomplete pain control, headache, blood pressure changes, nausea, vomiting, reactions to medications, itching and postpartum back pain. Confirmed with bedside nurse the patient's most recent platelet count. Confirmed with patient that they are not currently taking any anticoagulation, have any bleeding history or any family history of bleeding disorders. Patient expressed understanding and wished to proceed. All questions were answered. Sterile technique was used throughout the entire procedure. Please see nursing notes for vital signs. Test dose was given through epidural catheter and negative prior to continuing to dose epidural or start infusion. Warning signs of high block given to the patient including shortness of breath, tingling/numbness in hands, complete motor block, or any concerning symptoms with instructions to call for help. Patient  was given instructions on fall risk and not to get out of bed. All questions and concerns addressed with instructions to call with any issues or inadequate analgesia.    Reason for block:procedure for pain

## 2024-04-05 NOTE — Progress Notes (Signed)
 Patient ID: Hannah Esparza, female   DOB: 05-Feb-2001, 23 y.o.   MRN: [id number]  Called to patient room. Had several contractions back to back after starting pitocin at 2 milliunits/min and baby had prolonged decel to the 90s. Fluid resuscitation and position changes initiated. Terbutaline given. Heartrate improved back to baseline. Continued to  have moderate variability.   Will let baby recover, then restart pitocin at 1.

## 2024-04-05 NOTE — Progress Notes (Signed)
 Labor Progress Note Hannah Esparza is a 23 y.o. G1P0 at [redacted]w[redacted]d presented for IOL for gHTN  S: Doing well.  O:  BP 124/76   Pulse 81   Temp 98.9 F (37.2 C)   Resp 17   Ht 5' 5 (1.651 m)   Wt 78.9 kg   LMP 07/09/2023 (Approximate)   SpO2 100%   BMI 28.94 kg/m  EFM: 120/Moderate Variability/Accelerations (+),Decelerations (-)  CVE: Dilation: 4.5 Effacement (%): 80 Cervical Position: Middle Station: -2 Exam by:: Cathern Tahir md   A&P: 23 y.o. G1P0 [redacted]w[redacted]d  #Labor: Progressing well. Cervical check as above. AROM performed, clear fluid. Will start on pitocin 2x2. #Pain: Epidural in place #FWB: Category I #GBS negative #gHTN: Few mild range BP's but otherwise normotensive. Will continue to monitor.   Hannah Fenderson LITTIE Angles, MD 6:25 AM

## 2024-04-05 NOTE — Anesthesia Preprocedure Evaluation (Addendum)
 Anesthesia Evaluation  Patient identified by MRN, date of birth, ID band Patient awake    Reviewed: Allergy & Precautions, Patient's Chart, lab work & pertinent test results  Airway Mallampati: I       Dental no notable dental hx.    Pulmonary former smoker   Pulmonary exam normal        Cardiovascular negative cardio ROS Normal cardiovascular exam     Neuro/Psych negative neurological ROS  negative psych ROS   GI/Hepatic   Endo/Other    Renal/GU      Musculoskeletal   Abdominal   Peds  Hematology  (+) Blood dyscrasia, anemia   Anesthesia Other Findings   Reproductive/Obstetrics (+) Pregnancy                              Anesthesia Physical Anesthesia Plan  ASA: 2  Anesthesia Plan: Epidural   Post-op Pain Management:    Induction:   PONV Risk Score and Plan: 0  Airway Management Planned: Natural Airway  Additional Equipment: None  Intra-op Plan:   Post-operative Plan:   Informed Consent: I have reviewed the patients History and Physical, chart, labs and discussed the procedure including the risks, benefits and alternatives for the proposed anesthesia with the patient or authorized representative who has indicated his/her understanding and acceptance.       Plan Discussed with:   Anesthesia Plan Comments: (Lab Results      Component                Value               Date                      WBC                      10.1                04/04/2024                HGB                      12.6                04/04/2024                HCT                      37.4                04/04/2024                MCV                      91.7                04/04/2024                PLT                      171                 04/04/2024           )         Anesthesia Quick Evaluation

## 2024-04-06 DIAGNOSIS — O139 Gestational [pregnancy-induced] hypertension without significant proteinuria, unspecified trimester: Secondary | ICD-10-CM | POA: Diagnosis present

## 2024-04-06 LAB — COMPREHENSIVE METABOLIC PANEL WITH GFR
ALT: 20 U/L (ref 0–44)
AST: 32 U/L (ref 15–41)
Albumin: 2.3 g/dL — ABNORMAL LOW (ref 3.5–5.0)
Alkaline Phosphatase: 170 U/L — ABNORMAL HIGH (ref 38–126)
Anion gap: 9 (ref 5–15)
BUN: 10 mg/dL (ref 6–20)
CO2: 20 mmol/L — ABNORMAL LOW (ref 22–32)
Calcium: 8.4 mg/dL — ABNORMAL LOW (ref 8.9–10.3)
Chloride: 107 mmol/L (ref 98–111)
Creatinine, Ser: 0.75 mg/dL (ref 0.44–1.00)
GFR, Estimated: >60 mL/min (ref >60)
Glucose, Bld: 96 mg/dL (ref 70–99)
Potassium: 3.4 mmol/L — ABNORMAL LOW (ref 3.5–5.1)
Sodium: 136 mmol/L (ref 135–145)
Total Bilirubin: 0.3 mg/dL (ref 0.0–1.2)
Total Protein: 4.7 g/dL — ABNORMAL LOW (ref 6.5–8.1)

## 2024-04-06 LAB — CBC
HCT: 29 % — ABNORMAL LOW (ref 36.0–46.0)
Hemoglobin: 10.1 g/dL — ABNORMAL LOW (ref 12.0–15.0)
MCH: 31.7 pg (ref 26.0–34.0)
MCHC: 34.8 g/dL (ref 30.0–36.0)
MCV: 90.9 fL (ref 80.0–100.0)
Platelets: 142 K/uL — ABNORMAL LOW (ref 150–400)
RBC: 3.19 MIL/uL — ABNORMAL LOW (ref 3.87–5.11)
RDW: 14.7 % (ref 11.5–15.5)
WBC: 14.1 K/uL — ABNORMAL HIGH (ref 4.0–10.5)
nRBC: 0 % (ref 0.0–0.2)

## 2024-04-06 MED ORDER — POTASSIUM CHLORIDE CRYS ER 20 MEQ PO TBCR
20.0000 meq | EXTENDED_RELEASE_TABLET | Freq: Every day | ORAL | Status: DC
  Administered 2024-04-07: 20 meq via ORAL
  Filled 2024-04-06 (×2): qty 1

## 2024-04-06 MED ORDER — FUROSEMIDE 20 MG PO TABS
20.0000 mg | ORAL_TABLET | Freq: Every day | ORAL | Status: DC
  Administered 2024-04-07: 20 mg via ORAL
  Filled 2024-04-06 (×2): qty 1

## 2024-04-06 NOTE — Lactation Note (Signed)
 This note was copied from a baby's chart. Lactation Consultation Note  Patient Name: Hannah Esparza Unijb'd Date: 04/06/2024 Age:23 hours Reason for consult: Initial assessment;Early term 37-38.6wks;Primapara  P1. New mom needing assistance latching. Baby alert and eager to latch but was off and on frequently until he finally figured it out and stayed maintained in a good latch. Mom denies painful latches.  Newborn feeding habits, behavior, STS, I&O, body alignment, support, props reviewed.  Mom encouraged to feed baby 8-12 times/24 hours and with feeding cues. If baby hasn't cued in 3 hrs wake baby to feed. Worked w/mom on hand placement in latching. Encouraged to call for assistance as needed.  Maternal Data Has patient been taught Hand Expression?: Yes Does the patient have breastfeeding experience prior to this delivery?: No  Feeding    LATCH Score Latch: Repeated attempts needed to sustain latch, nipple held in mouth throughout feeding, stimulation needed to elicit sucking reflex.  Audible Swallowing: None  Type of Nipple: Everted at rest and after stimulation  Comfort (Breast/Nipple): Soft / non-tender  Hold (Positioning): Assistance needed to correctly position infant at breast and maintain latch.  LATCH Score: 6   Lactation Tools Discussed/Used    Interventions Interventions: Breast feeding basics reviewed;Assisted with latch;Skin to skin;Hand express;Breast compression;Adjust position;Support pillows;Position options;Education;LC Services brochure  Discharge Discharge Education: Outpatient recommendation Pump: DEBP WIC Program: Yes  Consult Status Consult Status: Follow-up Date: 04/06/24 Follow-up type: In-patient    Camisha Srey G 04/06/2024, 12:06 AM

## 2024-04-06 NOTE — Progress Notes (Signed)
 Post Partum Day *** Subjective: {subjective:3041426}  Objective: Blood pressure 121/77, pulse 83, temperature 98.1 F (36.7 C), temperature source Oral, resp. rate 18, height 5' 5 (1.651 m), weight 78.9 kg, last menstrual period 07/09/2023, SpO2 99%, unknown if currently breastfeeding.  Physical Exam:  General: {Exam; general:16600} Lochia: {Desc; appropriate/inappropriate:30686} Uterine Fundus: {Desc; firm/soft:30687} Incision: {Exam; incision:13523} DVT Evaluation: {Exam; dvt:13533}  Recent Labs    04/05/24 1743 04/06/24 0434  HGB 12.0 10.1*  HCT 34.9* 29.0*    Assessment/Plan: {assessment/plan:3041427}   LOS: 2 days   Hannah Esparza, CNM 04/06/2024, 7:56 PM

## 2024-04-06 NOTE — Anesthesia Postprocedure Evaluation (Signed)
 Anesthesia Post Note  Patient: Hannah Esparza  Procedure(s) Performed: AN AD HOC LABOR EPIDURAL     Patient location during evaluation: Mother Baby Anesthesia Type: Epidural Level of consciousness: awake, oriented and awake and alert Pain management: pain level controlled Vital Signs Assessment: post-procedure vital signs reviewed and stable Respiratory status: spontaneous breathing, nonlabored ventilation and respiratory function stable Cardiovascular status: stable Postop Assessment: no headache, patient able to bend at knees, adequate PO intake, able to ambulate and no apparent nausea or vomiting Anesthetic complications: no   No notable events documented.  Last Vitals:  Vitals:   04/06/24 0459 04/06/24 0855  BP: 124/72 122/76  Pulse: 87 80  Resp: 18 18  Temp: 36.8 C 36.7 C  SpO2: 99% 99%    Last Pain:  Vitals:   04/06/24 0743  TempSrc:   PainSc: 4    Pain Goal: Patients Stated Pain Goal: 0 (04/04/24 2030)                 Raynette Arras

## 2024-04-06 NOTE — Lactation Note (Addendum)
 This note was copied from a baby's chart. Lactation Consultation Note  Patient Name: Hannah Esparza Unijb'd Date: 04/06/2024 Age:23 hours Reason for consult: Follow-up assessment;1st time breastfeeding;Early term 37-38.6wks (infant wieght loss -5.31%) P1, Per MOB, infant is latching well he recently breastfeed for 30 minutes at 1700 pm. LC discussed infant may start cluster feeding on Day 2 of life and that this is a normal pattern of behavior. LC reviewed hand expression with breast model and MOB self expressed 2 mls of colostrum that was spoon fed to infant. MOB will continue to breastfeed infant by cues, on demand, 8-12 times within 24 hours, skin to skin. MOB knows she can hand express after infant breastfeeds and give EBM by spoon which is extra volume of colostrum Day 2 of life. LC reinforced importance of maternal rest, meals and hydration. MOB knows to call if she has any questions, concerns or need latch assistance.   Maternal Data    Feeding Mother's Current Feeding Choice: Breast Milk  LATCH Score  LC did not observe latch, infant recently breastfeed for 30 minutes at 1700 pm.                   Lactation Tools Discussed/Used    Interventions Interventions: Skin to skin;Hand express;Education  Discharge    Consult Status Consult Status: Follow-up Date: 04/07/24 Follow-up type: In-patient    Grayce LULLA Batter 04/06/2024, 6:04 PM

## 2024-04-07 ENCOUNTER — Other Ambulatory Visit (HOSPITAL_COMMUNITY): Payer: Self-pay

## 2024-04-07 ENCOUNTER — Encounter: Payer: Self-pay | Admitting: Certified Nurse Midwife

## 2024-04-07 ENCOUNTER — Encounter (HOSPITAL_BASED_OUTPATIENT_CLINIC_OR_DEPARTMENT_OTHER): Admitting: Certified Nurse Midwife

## 2024-04-07 MED ORDER — ACETAMINOPHEN 500 MG PO TABS: 1000.0000 mg | ORAL_TABLET | Freq: Four times a day (QID) | ORAL | Status: AC | PRN

## 2024-04-07 MED ORDER — SENNOSIDES-DOCUSATE SODIUM 8.6-50 MG PO TABS
1.0000 | ORAL_TABLET | Freq: Two times a day (BID) | ORAL | 2 refills | Status: AC | PRN
  Filled 2024-04-07: qty 30, 15d supply, fill #0

## 2024-04-07 MED ORDER — FUROSEMIDE 20 MG PO TABS
20.0000 mg | ORAL_TABLET | Freq: Every day | ORAL | 0 refills | Status: AC
  Filled 2024-04-07: qty 5, 5d supply, fill #0

## 2024-04-07 MED ORDER — IBUPROFEN 800 MG PO TABS
800.0000 mg | ORAL_TABLET | Freq: Three times a day (TID) | ORAL | 1 refills | Status: AC | PRN
  Filled 2024-04-07: qty 30, 10d supply, fill #0

## 2024-04-07 MED ORDER — POTASSIUM CHLORIDE CRYS ER 20 MEQ PO TBCR
20.0000 meq | EXTENDED_RELEASE_TABLET | Freq: Every day | ORAL | 0 refills | Status: AC
  Filled 2024-04-07: qty 5, 5d supply, fill #0

## 2024-04-07 MED ORDER — NIFEDIPINE ER 30 MG PO TB24
30.0000 mg | ORAL_TABLET | Freq: Every day | ORAL | 2 refills | Status: AC
  Filled 2024-04-07: qty 30, 30d supply, fill #0

## 2024-04-07 NOTE — Patient Instructions (Signed)
 Your appointment with Outpatient Lactation is: Date: 04/21/2024 Time: 8:30 am [facility] (First Floor) [address] [place] Hannah Esparza  Check in under baby's name.  Please bring your baby hungry along with your pump and a bottle of either formula or expressed breast milk. Please also bring your pump flanges and we welcome support people! If you need lactation assistance before your appointment, please call [phone] for lactation voice mail.  [facility] Lactation Support Group  Please join us  for our [facility] [employer] Lactation Support Group at [employer] [facility] We meet every Tuesday at 10:00 am to 12:00 pm at [employer] on the second floor in the conference room Lactating parents and lap babies are welcome, no registration is required, if you have a lactation pillow please bring

## 2024-04-07 NOTE — Progress Notes (Signed)
 TOC medications given to patient with discharge papers

## 2024-04-07 NOTE — Lactation Note (Addendum)
 This note was copied from a baby's chart. Lactation Consultation Note  Patient Name: Hannah Esparza Date: 04/07/2024 Age:23 hours Reason for consult: Follow-up assessment;1st time breastfeeding;Early term 37-38.6wks  P1, Baby was latched when [facility] entered the room on the L breast.  Intermittent swallows noted.  Mother had hand expressed 2 spoonfuls from the R breast before latching on the left breast and gave to baby.  Praised her for her efforts.  Mother complaining of discomfort on the R nipple.  Noted an abrasion on the tip.  R nipple is less evert, slightly inverted.  Mother is applying coconut oil and ebm for soreness.    Recommend trying to latch on the R breast later today.  Suggest prepumping/ hand expressing before latching.  Wait for wide open gape before latching.   Mother inquired about the correct flange size for her pump. Provided mother with manual pump.  18 mm flange seems too small at this time for the R nipple. Suggest trying the 21 mm flange which was given.  There is currently a 21 mm flange on her home pump.  Recommend the 18 mm or smaller for the L breast.  Advised to apply coconut oil or ebm to the tunnel portion of the flange before attempting fitting.  Reviewed engorgement care and monitoring voids/stools. Answered questions.    Maternal Data Has patient been taught Hand Expression?: Yes Does the patient have breastfeeding experience prior to this delivery?: No  Feeding Mother's Current Feeding Choice: Breast Milk  LATCH Score Latch: Grasps breast easily, tongue down, lips flanged, rhythmical sucking.  Audible Swallowing: A few with stimulation  Type of Nipple: Everted at rest and after stimulation  Comfort (Breast/Nipple): Filling, red/small blisters or bruises, mild/mod discomfort  Hold (Positioning): No assistance needed to correctly position infant at breast.  LATCH Score: 8   Lactation Tools Discussed/Used Tools: Flanges;Pump;Coconut  oil Flange Size: 18;21 Breast pump type: Manual Pump Education: Setup, frequency, and cleaning Reason for Pumping: prepump/ stimulation Pumping frequency: PRN  Interventions Interventions: Breast feeding basics reviewed;Pre-pump if needed;Hand pump;Education  Discharge Discharge Education: Engorgement and breast care;Warning signs for feeding baby Pump: Personal (Zoomie)  Consult Status Consult Status: Complete Date: 04/07/24    Hannah Esparza [facility] 04/07/2024, 11:10 AM

## 2024-04-08 ENCOUNTER — Encounter (HOSPITAL_BASED_OUTPATIENT_CLINIC_OR_DEPARTMENT_OTHER): Admitting: Obstetrics and Gynecology

## 2024-04-13 ENCOUNTER — Ambulatory Visit (HOSPITAL_BASED_OUTPATIENT_CLINIC_OR_DEPARTMENT_OTHER): Admitting: Obstetrics & Gynecology

## 2024-04-13 ENCOUNTER — Ambulatory Visit (HOSPITAL_BASED_OUTPATIENT_CLINIC_OR_DEPARTMENT_OTHER): Payer: Self-pay

## 2024-04-13 VITALS — BP 131/91 | HR 79 | Ht 65.5 in | Wt 159.0 lb

## 2024-04-13 VITALS — BP 136/91 | HR 79 | Ht 65.5 in | Wt 153.4 lb

## 2024-04-13 DIAGNOSIS — O165 Unspecified maternal hypertension, complicating the puerperium: Secondary | ICD-10-CM | POA: Diagnosis not present

## 2024-04-13 DIAGNOSIS — O9943 Diseases of the circulatory system complicating the puerperium: Secondary | ICD-10-CM

## 2024-04-13 DIAGNOSIS — Z0130 Encounter for examination of blood pressure without abnormal findings: Secondary | ICD-10-CM

## 2024-04-13 DIAGNOSIS — I456 Pre-excitation syndrome: Secondary | ICD-10-CM

## 2024-04-13 DIAGNOSIS — Z09 Encounter for follow-up examination after completed treatment for conditions other than malignant neoplasm: Secondary | ICD-10-CM

## 2024-04-14 ENCOUNTER — Encounter (HOSPITAL_BASED_OUTPATIENT_CLINIC_OR_DEPARTMENT_OTHER): Admitting: Certified Nurse Midwife

## 2024-04-15 ENCOUNTER — Encounter (HOSPITAL_BASED_OUTPATIENT_CLINIC_OR_DEPARTMENT_OTHER): Admitting: Obstetrics and Gynecology

## 2024-04-16 NOTE — Progress Notes (Signed)
 Patient added to Dr.Miller's schedule's on 04/13/2024 for further evaluation.

## 2024-04-18 NOTE — Progress Notes (Signed)
   GYNECOLOGY  VISIT  CC:   BP check   HPI: 23 y.o. G1P1001 Single Black or African American female here for BP check after having induction for gestational hypertension at 24 4/7 weeks when she was seen in the MAU for being in false labor.  Due to blood pressures, she was kept for induction.  NSVD on 11/3.  Blod pressures were normal initially but then borderline so she was sent home on procardia XL and lasix with K.  Here for blood pressure check.  No headaches.  Mild swelling.  Did take the lasix and potassium.    Patient's last menstrual period was 07/09/2023 (approximate).  Past Medical History:  Diagnosis Date   Viral warts 06/07/2008   Qualifier: Diagnosis of   By: Johnny MD, Garnette LABOR     IMO SNOMED Dx Update Oct 2024     Wolff-Parkinson-White syndrome     MEDS:  Reviewed in EPIC  ALLERGIES: Patient has no known allergies.  SH:  single, non smoker  Review of Systems  Constitutional: Negative.   Genitourinary: Negative.     PHYSICAL EXAMINATION:    BP (!) 131/91   Pulse 79   Ht 5' 5.5 (1.664 m)   Wt 159 lb (72.1 kg)   LMP 07/09/2023 (Approximate)   BMI 26.06 kg/m    Vitals were taken 11/11 at 10am General appearance: alert, cooperative and appears stated age CV:  Regular rate and rhythm Lungs:  clear to auscultation, no wheezes, rales or rhonchi, symmetric air entry Ext: trace edema bilaterally  Assessment/Plan: 1. Postpartum hypertension (Primary) - encouraged taking procardia XL 30mg  daily - recheck BP 1 week - suspect this is transient and that she will likely be able to stop this.  Recommended she monitor at home.  If has SBP <100 or DBP <60, advised to let us  know and to hold procardia.  2. Wolff-Parkinson-White syndrome - has follow up with Dr. Sheena on 12/23

## 2024-04-20 ENCOUNTER — Ambulatory Visit (HOSPITAL_BASED_OUTPATIENT_CLINIC_OR_DEPARTMENT_OTHER)

## 2024-04-20 ENCOUNTER — Encounter (HOSPITAL_BASED_OUTPATIENT_CLINIC_OR_DEPARTMENT_OTHER): Payer: Self-pay

## 2024-04-20 VITALS — BP 119/89 | HR 79 | Wt 148.4 lb

## 2024-04-20 DIAGNOSIS — Z0130 Encounter for examination of blood pressure without abnormal findings: Secondary | ICD-10-CM

## 2024-04-20 NOTE — Progress Notes (Signed)
 NURSE VISIT- BLOOD PRESSURE CHECK  SUBJECTIVE:  Hannah Esparza is a 23 y.o. G13P1001 female here for BP check. She is postpartum, delivery date 04/05/2024    HYPERTENSION ROS:  Pregnant/postpartum:  Severe headaches that don't go away with tylenol /other medicines: No  Visual changes (seeing spots/double/blurred vision) No  Severe pain under right breast breast or in center of upper chest No  Severe nausea/vomiting No  Taking medicines as instructed yes  GYN patient: Taking medicines as instructed yes Headaches  No Chest pain No Shortness of breath No Swelling in legs/ankles No  OBJECTIVE:  BP 119/89   Pulse 79   Wt 148 lb 6.4 oz (67.3 kg)   LMP 07/09/2023 (Approximate)   Breastfeeding Yes   BMI 24.32 kg/m   Appearance alert, well appearing, and in no distress and oriented to person, place, and time.  ASSESSMENT: Postpartum  blood pressure check  PLAN: Discussed with Recommendations: no changes needed   Follow-up: as scheduled

## 2024-04-21 ENCOUNTER — Encounter (HOSPITAL_BASED_OUTPATIENT_CLINIC_OR_DEPARTMENT_OTHER): Admitting: Obstetrics & Gynecology

## 2024-04-26 ENCOUNTER — Other Ambulatory Visit (HOSPITAL_COMMUNITY): Payer: Self-pay

## 2024-04-28 ENCOUNTER — Encounter (HOSPITAL_BASED_OUTPATIENT_CLINIC_OR_DEPARTMENT_OTHER): Admitting: Certified Nurse Midwife

## 2024-05-20 ENCOUNTER — Other Ambulatory Visit (HOSPITAL_BASED_OUTPATIENT_CLINIC_OR_DEPARTMENT_OTHER): Payer: Self-pay

## 2024-05-20 ENCOUNTER — Ambulatory Visit (INDEPENDENT_AMBULATORY_CARE_PROVIDER_SITE_OTHER): Payer: Self-pay | Admitting: Obstetrics and Gynecology

## 2024-05-20 DIAGNOSIS — F53 Postpartum depression: Secondary | ICD-10-CM | POA: Diagnosis not present

## 2024-05-20 DIAGNOSIS — Z30011 Encounter for initial prescription of contraceptive pills: Secondary | ICD-10-CM

## 2024-05-20 DIAGNOSIS — Z8759 Personal history of other complications of pregnancy, childbirth and the puerperium: Secondary | ICD-10-CM | POA: Diagnosis not present

## 2024-05-20 MED ORDER — SLYND 4 MG PO TABS
1.0000 | ORAL_TABLET | Freq: Every day | ORAL | 3 refills | Status: AC
  Filled 2024-05-20: qty 84, 84d supply, fill #0

## 2024-05-20 NOTE — Progress Notes (Signed)
 Post Partum Visit Note  Hannah Esparza is a 23 y.o. G47P1001 female who presents for a postpartum visit. She is 6 weeks postpartum following a normal spontaneous vaginal delivery.  I have fully reviewed the prenatal and intrapartum course. The delivery was at 38.5 gestational weeks.  Anesthesia: epidural. Postpartum course has been good. Baby is doing well. Baby is feeding by breast. Bleeding no bleeding. Bowel function is normal. Bladder function is normal. Patient is not sexually active. Contraception method is POPs. Postpartum depression screening: negative.   The pregnancy intention screening data noted above was reviewed. Potential methods of contraception were discussed. The patient elected to proceed with No data recorded.    Health Maintenance Due  Topic Date Due   DTaP/Tdap/Td (6 - Tdap) 10/20/2011   HPV VACCINES (1 - 3-dose series) Never done   Meningococcal B Vaccine (1 of 2 - Standard) Never done   Influenza Vaccine  Never done   COVID-19 Vaccine (1 - 2025-26 season) Never done    The following portions of the patient's history were reviewed and updated as appropriate: allergies, current medications, past family history, past medical history, past social history, past surgical history, and problem list.  Review of Systems Pertinent items are noted in HPI.  Objective:  BP 105/73 (BP Location: Left Arm, Patient Position: Sitting)   Ht 5' 5 (1.651 m)   Wt 136 lb (61.7 kg)   LMP 07/09/2023 (Approximate)   Breastfeeding Yes   BMI 22.63 kg/m    General:  alert and cooperative   Breasts:  not indicated  Lungs: Normal effort      Abdomen: nondistended      GU exam:  normal       Assessment:   1. Postpartum exam (Primary)   2. Gestational hypertension, antepartum D/c meds no longer needs them, normotensive today   3. Encounter for initial prescription of contraceptive pills Desires POPs today, discussed initiation, back up method Neg UPT  - Drospirenone   (SLYND ) 4 MG TABS; Take 1 tablet (4 mg total) by mouth daily.  Dispense: 84 tablet; Refill: 3  4. Postpartum depression Discussed options for support to include  counseling and/or meds. Would like to start with counseling, referral placed  - Amb ref to Integrated Behavioral Health   Plan:   Essential components of care per ACOG recommendations:  1.  Mood and well being: Patient with positive depression screening today. Reviewed local resources for support.  - Patient tobacco use? No.   - hx of drug use? No.    2. Infant care and feeding:  -Patient currently breastmilk feeding? Yes. Reviewed importance of draining breast regularly to support lactation.  -Social determinants of health (SDOH) reviewed in EPIC. No concerns  3. Sexuality, contraception and birth spacing - Patient does not want a pregnancy in the next year.   - Reviewed reproductive life planning. Reviewed contraceptive methods based on pt preferences and effectiveness.  Patient desired Oral Contraceptive today.   - Discussed birth spacing of 18 months  4. Sleep and fatigue -Encouraged family/partner/community support of 4 hrs of uninterrupted sleep to help with mood and fatigue  5. Physical Recovery  - Discussed patients delivery and complications. She describes her labor as good. - Patient had a Vaginal, no problems at delivery. Patient had a none laceration. Perineal healing reviewed. Patient expressed understanding - Patient has urinary incontinence? No. - Patient is safe to resume physical and sexual activity  6.  Health Maintenance - HM due items addressed  Yes - Last pap smear  Diagnosis  Date Value Ref Range Status  10/01/2023   Final   - Negative for Intraepithelial Lesions or Malignancy (NILM)  10/01/2023 - Benign reactive/reparative changes  Final   Pap smear not done at today's visit.  -Breast Cancer screening indicated? No.   7. Chronic Disease/Pregnancy Condition follow up: None  - PCP follow  up  Nidia Daring, FNP [facility] [employer], [facility]

## 2024-05-25 ENCOUNTER — Ambulatory Visit: Admitting: Cardiology

## 2024-05-31 ENCOUNTER — Encounter (HOSPITAL_BASED_OUTPATIENT_CLINIC_OR_DEPARTMENT_OTHER): Payer: Self-pay | Admitting: Obstetrics & Gynecology

## 2024-06-02 ENCOUNTER — Ambulatory Visit: Payer: Self-pay | Admitting: Licensed Clinical Social Worker

## 2024-06-02 DIAGNOSIS — F53 Postpartum depression: Secondary | ICD-10-CM | POA: Diagnosis not present

## 2024-06-04 NOTE — BH Specialist Note (Signed)
 "   Integrated Behavioral Health via Telemedicine Visit  06/04/2024 Hannah Esparza [id number]  Number of Integrated Behavioral Health Clinician visits: 1- Initial Visit  Session Start time: 1315   Session End time: 1430  Total time in minutes: 75    Referring Provider: Nidia Daring Patient/Family location: Home [facility] Provider location: Remote Office All persons participating in visit: Patient and [facility] Types of Service: Individual psychotherapy and Video visit  I connected with Hannah Esparza and/or Hannah Esparza's patient via  Telephone or [employer], Civil (consulting)  (Video is Caregility application) and verified that I am speaking with the correct person using two identifiers. Discussed confidentiality: Yes   I discussed the limitations of telemedicine and the availability of in person appointments.  Discussed there is a possibility of technology failure and discussed alternative modes of communication if that failure occurs.  I discussed that engaging in this telemedicine visit, they consent to the provision of behavioral healthcare and the services will be billed under their insurance.  Patient and/or legal guardian expressed understanding and consented to Telemedicine visit: Yes   Presenting Concerns: Patient and/or family reports the following symptoms/concerns: increased postpartum symptoms.  Duration of problem: Months; Severity of problem: moderate  Patient and/or Family's Strengths/Protective Factors: Social and Emotional competence, Concrete supports in place (healthy food, safe environments, etc.), and Caregiver has knowledge of parenting & child development  Goals Addressed: Patient will:  Reduce symptoms of: anxiety and mood instability   Increase knowledge and/or ability of: coping skills, healthy habits, and self-management skills   Demonstrate ability to: Increase healthy adjustment to current life circumstances  Progress towards  Goals: Ongoing    Interventions: Interventions utilized:  Solution-Focused Strategies, Supportive Counseling, Psychoeducation and/or Health Education, Communication Skills, and Supportive Reflection Standardized Assessments completed: Not Needed    Patient and/or Family Response: The patient was present for todays virtual appointment. She reported recently giving birth to her two-month-old son and described the postpartum period as somewhat difficult to adjust to. She reported a strained relationship with her grandmother, with whom she was previously living, and stated she has since moved out. The patient is currently visiting family in [place]  and plans to return to her grandmother's home later this week due to her sons upcoming well-child appointment. She reported feeling uneasy and irritable related to a lack of stability, specifically not having her own housing or a vehicle. Despite these stressors, she identified strong family support in [place]  and stated her family is supportive of both her and her son. The patient reported feeling conflicted about whether to remain in [place] , where her grandfather has offered assistance in helping her get on her feet, or to relocate temporarily to [place]  to stay with her boyfriends mother. She expressed concern about potential relationship outcomes if she remains in [place]  while her boyfriend returns to [place] . The patient plans to have a conversation with her boyfriend regarding future plans  Clinical Assessment/Diagnosis  Postpartum depression    Assessment: Patient currently experiencing postpartum adjustment stress, irritability, and uncertainty related to housing instability, transportation barriers, and relationship decision-making. Protective factors include strong family support, insight into her needs, and proactive problem-solving..   Patient may benefit from continued support of integrated behavioral health  services.  Plan: Follow up with behavioral health clinician on : 06/11/2023 Behavioral recommendations: patient to continue utilizing family support and to prioritize stability and safety when making housing and relocation decisions. Recommend ongoing therapy to support postpartum adjustment, emotional regulation, and decision-making related to relationships  and long-term planning. Referral(s): Integrated [employer] (In Clinic)  I discussed the assessment and treatment plan with the patient and/or parent/guardian. They were provided an opportunity to ask questions and all were answered. They agreed with the plan and demonstrated an understanding of the instructions.   They were advised to call back or seek an in-person evaluation if the symptoms worsen or if the condition fails to improve as anticipated.  Hannah Esparza, LCSWA "

## 2024-06-10 ENCOUNTER — Encounter: Payer: Self-pay | Admitting: Licensed Clinical Social Worker

## 2024-06-11 ENCOUNTER — Ambulatory Visit: Payer: Self-pay | Admitting: Licensed Clinical Social Worker

## 2024-06-15 ENCOUNTER — Ambulatory Visit: Payer: Self-pay | Admitting: Licensed Clinical Social Worker

## 2024-06-15 DIAGNOSIS — F53 Postpartum depression: Secondary | ICD-10-CM
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
## 2024-06-23 ENCOUNTER — Ambulatory Visit: Payer: Self-pay | Admitting: Licensed Clinical Social Worker

## 2024-06-23 NOTE — BH Specialist Note (Signed)
 Patient was present for the virtual appointment and advised today wasn't a good day. Patient asked to reschedule appointment. Appointment rescheduled for 06/30/2024

## 2024-06-30 ENCOUNTER — Ambulatory Visit: Payer: Self-pay | Admitting: Licensed Clinical Social Worker

## 2024-06-30 DIAGNOSIS — F53 Postpartum depression: Secondary | ICD-10-CM

## 2024-06-30 NOTE — BH Specialist Note (Signed)
 A user error has taken place.
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
## 2024-07-08 ENCOUNTER — Encounter: Payer: Self-pay | Admitting: Licensed Clinical Social Worker
